# Patient Record
Sex: Female | Born: 1965 | Race: White | Hispanic: No | Marital: Married | Smoking: Current every day smoker
Health system: Southern US, Community
[De-identification: ages and names within clinical notes are randomized; demographics above are authoritative.]

## PROBLEM LIST (undated history)

## (undated) ENCOUNTER — Inpatient Hospital Stay (HOSPITAL_COMMUNITY): Payer: No Typology Code available for payment source

## (undated) ENCOUNTER — Emergency Department (HOSPITAL_COMMUNITY): Admission: EM | Discharge status: Against Medical Advice | Payer: No Typology Code available for payment source

## (undated) HISTORY — PX: OTHER SURGICAL HISTORY: SHX169

---

## 1997-12-03 ENCOUNTER — Other Ambulatory Visit: Admission: RE | Admit: 1997-12-03 | Discharge: 1997-12-03 | Payer: Self-pay | Admitting: Obstetrics & Gynecology

## 1997-12-10 ENCOUNTER — Inpatient Hospital Stay (HOSPITAL_COMMUNITY): Admission: AD | Admit: 1997-12-10 | Discharge: 1997-12-16 | Payer: Self-pay | Admitting: Obstetrics and Gynecology

## 2012-10-09 ENCOUNTER — Encounter (HOSPITAL_COMMUNITY): Payer: Self-pay | Admitting: *Deleted

## 2012-10-09 ENCOUNTER — Emergency Department (HOSPITAL_COMMUNITY): Payer: Medicaid Other

## 2012-10-09 ENCOUNTER — Observation Stay (HOSPITAL_COMMUNITY)
Admission: EM | Admit: 2012-10-09 | Discharge: 2012-10-10 | Disposition: A | Discharge status: Home / Self Care | Payer: Medicaid Other | Attending: Cardiology | Admitting: Cardiology

## 2012-10-09 DIAGNOSIS — Z8249 Family history of ischemic heart disease and other diseases of the circulatory system: Secondary | ICD-10-CM | POA: Insufficient documentation

## 2012-10-09 DIAGNOSIS — R079 Chest pain, unspecified: Principal | ICD-10-CM | POA: Insufficient documentation

## 2012-10-09 DIAGNOSIS — R0602 Shortness of breath: Secondary | ICD-10-CM | POA: Insufficient documentation

## 2012-10-09 DIAGNOSIS — Z87891 Personal history of nicotine dependence: Secondary | ICD-10-CM | POA: Insufficient documentation

## 2012-10-09 DIAGNOSIS — K219 Gastro-esophageal reflux disease without esophagitis: Secondary | ICD-10-CM | POA: Insufficient documentation

## 2012-10-09 LAB — CBC
Hemoglobin: 14.6 g/dL (ref 12.0–15.0)
MCV: 92.8 fL (ref 78.0–100.0)
Platelets: 259 10*3/uL (ref 150–400)
RBC: 4.46 MIL/uL (ref 3.87–5.11)
WBC: 7.3 10*3/uL (ref 4.0–10.5)

## 2012-10-09 LAB — CBC WITH DIFFERENTIAL/PLATELET
Basophils Absolute: 0 10*3/uL (ref 0.0–0.1)
Basophils Relative: 0 % (ref 0–1)
Eosinophils Absolute: 0.1 10*3/uL (ref 0.0–0.7)
MCH: 32.4 pg (ref 26.0–34.0)
MCHC: 35.2 g/dL (ref 30.0–36.0)
Monocytes Relative: 5 % (ref 3–12)
Neutro Abs: 6.1 10*3/uL (ref 1.7–7.7)
Neutrophils Relative %: 71 % (ref 43–77)
Platelets: 257 10*3/uL (ref 150–400)
RDW: 12.8 % (ref 11.5–15.5)

## 2012-10-09 LAB — COMPREHENSIVE METABOLIC PANEL
ALT: 12 U/L (ref 0–35)
ALT: 11 U/L (ref 0–35)
AST: 16 U/L (ref 0–37)
Albumin: 3.6 g/dL (ref 3.5–5.2)
CO2: 26 mEq/L (ref 19–32)
Calcium: 9.3 mg/dL (ref 8.4–10.5)
Chloride: 104 mEq/L (ref 96–112)
Creatinine, Ser: 0.79 mg/dL (ref 0.50–1.10)
GFR calc Af Amer: >90 mL/min (ref >90)
GFR calc non Af Amer: >90 mL/min (ref >90)
Glucose, Bld: 117 mg/dL — ABNORMAL HIGH (ref 70–99)
Potassium: 3.6 mEq/L (ref 3.5–5.1)
Sodium: 138 mEq/L (ref 135–145)
Total Bilirubin: 0.2 mg/dL — ABNORMAL LOW (ref 0.3–1.2)
Total Protein: 6.6 g/dL (ref 6.0–8.3)

## 2012-10-09 LAB — PROTIME-INR
INR: 0.99 (ref 0.00–1.49)
Prothrombin Time: 13 seconds (ref 11.6–15.2)

## 2012-10-09 LAB — TROPONIN I: Troponin I: <0.3 ng/mL (ref <0.30)

## 2012-10-09 LAB — POCT I-STAT TROPONIN I: Troponin i, poc: 0 ng/mL (ref 0.00–0.08)

## 2012-10-09 LAB — APTT: aPTT: 36 seconds (ref 24–37)

## 2012-10-09 MED ORDER — ASPIRIN 300 MG RE SUPP
300.0000 mg | RECTAL | Status: DC
  Filled 2012-10-09: qty 1

## 2012-10-09 MED ORDER — METOPROLOL TARTRATE 12.5 MG HALF TABLET
12.5000 mg | ORAL_TABLET | Freq: Two times a day (BID) | ORAL | Status: DC
  Administered 2012-10-09: 12.5 mg via ORAL
  Filled 2012-10-09 (×3): qty 1

## 2012-10-09 MED ORDER — HEPARIN SODIUM (PORCINE) 5000 UNIT/ML IJ SOLN
5000.0000 [IU] | Freq: Three times a day (TID) | INTRAMUSCULAR | Status: DC
  Filled 2012-10-09 (×5): qty 1

## 2012-10-09 MED ORDER — ASPIRIN 81 MG PO CHEW: 324.0000 mg | CHEWABLE_TABLET | ORAL | Status: DC

## 2012-10-09 MED ORDER — IOHEXOL 350 MG/ML SOLN
100.0000 mL | Freq: Once | INTRAVENOUS | Status: AC | PRN
  Administered 2012-10-09: 100 mL via INTRAVENOUS

## 2012-10-09 MED ORDER — ONDANSETRON HCL 4 MG/2ML IJ SOLN: 4.0000 mg | Freq: Four times a day (QID) | INTRAMUSCULAR | Status: DC | PRN

## 2012-10-09 MED ORDER — ATORVASTATIN CALCIUM 40 MG PO TABS
40.0000 mg | ORAL_TABLET | Freq: Every day | ORAL | Status: DC
  Filled 2012-10-09: qty 1

## 2012-10-09 MED ORDER — NITROGLYCERIN 0.4 MG SL SUBL: 0.4000 mg | SUBLINGUAL_TABLET | SUBLINGUAL | Status: DC | PRN

## 2012-10-09 MED ORDER — ACETAMINOPHEN 325 MG PO TABS: 650.0000 mg | ORAL_TABLET | ORAL | Status: DC | PRN

## 2012-10-09 MED ORDER — ASPIRIN 81 MG PO CHEW
324.0000 mg | CHEWABLE_TABLET | Freq: Once | ORAL | Status: AC
  Administered 2012-10-09: 324 mg via ORAL
  Filled 2012-10-09 (×2): qty 4

## 2012-10-09 MED ORDER — ASPIRIN EC 81 MG PO TBEC
81.0000 mg | DELAYED_RELEASE_TABLET | Freq: Every day | ORAL | Status: DC
  Administered 2012-10-10: 81 mg via ORAL
  Filled 2012-10-09: qty 1

## 2012-10-09 MED ORDER — PANTOPRAZOLE SODIUM 40 MG PO TBEC
40.0000 mg | DELAYED_RELEASE_TABLET | Freq: Every day | ORAL | Status: DC
  Filled 2012-10-09: qty 1

## 2012-10-09 NOTE — ED Notes (Signed)
Pt currently denying chest pain at the moment.

## 2012-10-09 NOTE — ED Provider Notes (Signed)
History     CSN: [id number]  Arrival date & time 10/09/12  1319   First MD Initiated Contact with Patient 10/09/12 1655      Chief Complaint  Patient presents with  . Chest Pain    (Consider location/radiation/quality/duration/timing/severity/associated sxs/prior treatment) Patient is a 47 y.o. female presenting with chest pain. The history is provided by the patient.  Chest Pain Pain location:  L chest Pain quality: pressure   Pain radiates to:  Does not radiate Pain radiates to the back: no   Pain severity:  Moderate Onset quality:  Unable to specify Timing:  Constant Progression:  Waxing and waning Chronicity:  New Context: breathing   Context: no drug use, not eating, no intercourse, not lifting, no movement, not raising an arm, not at rest, no stress and no trauma   Worsened by:  Nothing tried Ineffective treatments:  None tried Associated symptoms: shortness of breath   Associated symptoms: no abdominal pain   Risk factors: smoking   Risk factors comment:  Family history- dad died 70 mi    History reviewed. No pertinent past medical history.  Past Surgical History  Procedure Laterality Date  . Cesarian    . Cesarean section      No family history on file.  History  Substance Use Topics  . Smoking status: Current Every Day Smoker -- 1.00 packs/day    Types: Cigarettes  . Smokeless tobacco: Not on file  . Alcohol Use: Yes     Comment: occasional    OB History   Grav Para Term Preterm Abortions TAB SAB Ect Mult Living                  Review of Systems  Constitutional: Positive for unexpected weight change.  HENT: Positive for neck pain.   Eyes: Negative.   Respiratory: Positive for shortness of breath.   Cardiovascular: Positive for chest pain.  Gastrointestinal: Negative.  Negative for abdominal pain.  Endocrine: Negative.   Genitourinary: Negative.   Skin: Negative.   Allergic/Immunologic: Negative.   Neurological: Negative.    Hematological: Negative.   Psychiatric/Behavioral: Negative.     Allergies  Review of patient's allergies indicates no known allergies.  Home Medications  No current outpatient prescriptions on file.  BP 151/82  Pulse 94  Temp(Src) 98 F (36.7 C) (Oral)  Resp 20  Ht 5\' 7"  (1.702 m)  Wt 131 lb (59.421 kg)  BMI 20.51 kg/m2  SpO2 94%  LMP 10/01/2012  Physical Exam  ED Course  Procedures (including critical care time)  Labs Reviewed  COMPREHENSIVE METABOLIC PANEL - Abnormal; Notable for the following:    Total Bilirubin 0.2 (*)    All other components within normal limits  CBC  LIPASE, BLOOD  TROPONIN I  POCT I-STAT TROPONIN I   Dg Chest 2 View  10/09/2012  *RADIOLOGY REPORT*  Clinical Data: Chest pain and shortness of breath.  CHEST - 2 VIEW  Comparison: No priors.  Findings: Lung volumes are normal.  No consolidative airspace disease.  No pleural effusions.  No pneumothorax.  No pulmonary nodule or mass noted.  Pulmonary vasculature and the cardiomediastinal silhouette are within normal limits.  IMPRESSION: 1. No radiographic evidence of acute cardiopulmonary disease.   Original Report Authenticated By: Trudie Reed, M.D.      No diagnosis found.   Dg Chest 2 View  10/09/2012  *RADIOLOGY REPORT*  Clinical Data: Chest pain and shortness of breath.  CHEST - 2 VIEW  Comparison: No  priors.  Findings: Lung volumes are normal.  No consolidative airspace disease.  No pleural effusions.  No pneumothorax.  No pulmonary nodule or mass noted.  Pulmonary vasculature and the cardiomediastinal silhouette are within normal limits.  IMPRESSION: 1. No radiographic evidence of acute cardiopulmonary disease.   Original Report Authenticated By: Trudie Reed, M.D.    Ct Angio Chest Pe W/cm &/or Wo Cm  10/09/2012  *RADIOLOGY REPORT*  Clinical Data: Left-sided chest pain  CT ANGIOGRAPHY CHEST  Technique:  Multidetector CT imaging of the chest using the standard protocol during bolus  administration of intravenous contrast. Multiplanar reconstructed images including MIPs were obtained and reviewed to evaluate the vascular anatomy.  Contrast: OMNIPAQUE IOHEXOL 350 MG/ML SOLN  Comparison: Chest radiograph same date  Findings: Diffuse emphysematous changes are noted.  Central airways are patent.  Minimal dependent bibasilar, right greater than left, presumed atelectasis.  No nodule, mass, or consolidation.  Thyroid is grossly normal.  Great vessels are normal in caliber. Heart size is normal.  No lymphadenopathy.  No pericardial or pleural effusion.  Incomplete imaging of the upper abdomen demonstrates normal-appearing adrenal glands.  The study is of adequate technical quality for evaluation for pulmonary embolism up to and including the 3rd order pulmonary arteries. No focal filling defect is seen to suggest acute pulmonary embolism.  No acute osseous finding.  IMPRESSION: No acute cardiopulmonary process or evidence for acute pulmonary embolism.  Emphysematous changes.   Original Report Authenticated By: Christiana Pellant, M.D.    MDM   this is a 47 year old female with a history of smoking since age 34 and a father who died at age 57 of massive myocardial infarction. She has been having left-sided chest pain intermittently for 3 weeks. EKG initial evaluation revealed no evidence of cardiac ischemia. However, I am concerned due to her strong family history, history of smoking that this is angina. I have spoken with Dr. Sharyn Lull and he will be in to see and evaluate the patient.       Hilario Quarry, MD 10/09/12 2032

## 2012-10-09 NOTE — ED Notes (Signed)
Pt c/o L sided chest pain that radiates to her central back and up her L neck x 3 weeks.  C/o sob with increased activity, but denies edema.  C/o nausea and vomiting yesterday am.

## 2012-10-09 NOTE — H&P (Signed)
[name] is an 47 y.o. female.   Chief Complaint: Recurrent chest pain  HPI: Patient is 47 year old female with no significant past medical history except for tobacco abuse 30 pack years, strong family history of coronary artery disease father died at age of 32 due to massive MI came to the ER complaining of recurrent retrosternal and left-sided chest pain off and on for last few weeks today pain got worse radiated to left shoulder and left side of neck sore decided to come to the ER. Denies nausea vomiting diaphoresis. Denies palpitation lightheadedness or syncope. Patient denies any history of PND orthopnea leg swelling Complains of mild shortness of breath. Denies cough fever chills. Denies any history of exertional chest pain. Denies any cardiac workup in the past. Denies any history of exertional chest pain although activity limited. Denies any relation of chest pain to food breathing or movement.  History reviewed. No pertinent past medical history.  Past Surgical History  Procedure Laterality Date  . Cesarian    . Cesarean section      No family history on file. Social History:  reports that she has been smoking Cigarettes.  She has been smoking about 1.00 pack per day. She does not have any smokeless tobacco history on file. She reports that  drinks alcohol. She reports that she uses illicit drugs (Marijuana).  Allergies: No Known Allergies   (Not in a hospital admission)  Results for orders placed during the hospital encounter of 10/09/12 (from the past 48 hour(s))  CBC     Status: None   Collection Time    10/09/12  1:40 PM      Result Value Range   WBC 7.3  4.0 - 10.5 K/uL   RBC 4.46  3.87 - 5.11 MIL/uL   Hemoglobin 14.6  12.0 - 15.0 g/dL   HCT 16.1  09.6 - 04.5 %   MCV 92.8  78.0 - 100.0 fL   MCH 32.7  26.0 - 34.0 pg   MCHC 35.3  30.0 - 36.0 g/dL   RDW 40.9  81.1 - 91.4 %   Platelets 259  150 - 400 K/uL  COMPREHENSIVE METABOLIC PANEL     Status: Abnormal   Collection Time    10/09/12  1:40 PM      Result Value Range   Sodium 139  135 - 145 mEq/L   Potassium 3.6  3.5 - 5.1 mEq/L   Chloride 104  96 - 112 mEq/L   CO2 26  19 - 32 mEq/L   Glucose, Bld 92  70 - 99 mg/dL   BUN 6  6 - 23 mg/dL   Creatinine, Ser 7.82  0.50 - 1.10 mg/dL   Calcium 9.4  8.4 - 95.6 mg/dL   Total Protein 6.7  6.0 - 8.3 g/dL   Albumin 3.7  3.5 - 5.2 g/dL   AST 16  0 - 37 U/L   ALT 12  0 - 35 U/L   Alkaline Phosphatase 62  39 - 117 U/L   Total Bilirubin 0.2 (*) 0.3 - 1.2 mg/dL   GFR calc non Af Amer >90  >90 mL/min   GFR calc Af Amer >90  >90 mL/min   Comment:            The eGFR has been calculated     using the CKD EPI equation.     This calculation has not been     validated in all clinical     situations.  eGFR's persistently     <90 mL/min signify     possible Chronic Kidney Disease.  LIPASE, BLOOD     Status: None   Collection Time    10/09/12  1:40 PM      Result Value Range   Lipase 33  11 - 59 U/L  POCT I-STAT TROPONIN I     Status: None   Collection Time    10/09/12  1:56 PM      Result Value Range   Troponin i, poc 0.00  0.00 - 0.08 ng/mL   Comment 3            Comment: Due to the release kinetics of cTnI,     a negative result within the first hours     of the onset of symptoms does not rule out     myocardial infarction with certainty.     If myocardial infarction is still suspected,     repeat the test at appropriate intervals.  TROPONIN I     Status: None   Collection Time    10/09/12  5:21 PM      Result Value Range   Troponin I <0.30  <0.30 ng/mL   Comment:            Due to the release kinetics of cTnI,     a negative result within the first hours     of the onset of symptoms does not rule out     myocardial infarction with certainty.     If myocardial infarction is still suspected,     repeat the test at appropriate intervals.   Dg Chest 2 View  10/09/2012  *RADIOLOGY REPORT*  Clinical Data: Chest pain and shortness of  breath.  CHEST - 2 VIEW  Comparison: No priors.  Findings: Lung volumes are normal.  No consolidative airspace disease.  No pleural effusions.  No pneumothorax.  No pulmonary nodule or mass noted.  Pulmonary vasculature and the cardiomediastinal silhouette are within normal limits.  IMPRESSION: 1. No radiographic evidence of acute cardiopulmonary disease.   Original Report Authenticated By: Trudie Reed, M.D.    Ct Angio Chest Pe W/cm &/or Wo Cm  10/09/2012  *RADIOLOGY REPORT*  Clinical Data: Left-sided chest pain  CT ANGIOGRAPHY CHEST  Technique:  Multidetector CT imaging of the chest using the standard protocol during bolus administration of intravenous contrast. Multiplanar reconstructed images including MIPs were obtained and reviewed to evaluate the vascular anatomy.  Contrast: OMNIPAQUE IOHEXOL 350 MG/ML SOLN  Comparison: Chest radiograph same date  Findings: Diffuse emphysematous changes are noted.  Central airways are patent.  Minimal dependent bibasilar, right greater than left, presumed atelectasis.  No nodule, mass, or consolidation.  Thyroid is grossly normal.  Great vessels are normal in caliber. Heart size is normal.  No lymphadenopathy.  No pericardial or pleural effusion.  Incomplete imaging of the upper abdomen demonstrates normal-appearing adrenal glands.  The study is of adequate technical quality for evaluation for pulmonary embolism up to and including the 3rd order pulmonary arteries. No focal filling defect is seen to suggest acute pulmonary embolism.  No acute osseous finding.  IMPRESSION: No acute cardiopulmonary process or evidence for acute pulmonary embolism.  Emphysematous changes.   Original Report Authenticated By: Christiana Pellant, M.D.     Review of Systems  Constitutional: Negative for fever and chills.  Eyes: Negative for blurred vision and double vision.  Respiratory: Negative for cough, hemoptysis, sputum production and shortness of breath.  Cardiovascular:  Positive for chest pain. Negative for palpitations, orthopnea, claudication, leg swelling and PND.  Gastrointestinal: Negative for nausea, vomiting and abdominal pain.  Neurological: Negative for dizziness and headaches.    Blood pressure 127/70, pulse 66, temperature 98 F (36.7 C), temperature source Oral, resp. rate 13, height 5\' 7"  (1.702 m), weight 59.421 kg (131 lb), last menstrual period 10/01/2012, SpO2 99.00%. Physical Exam  Constitutional: She is oriented to person, place, and time.  HENT:  Head: Normocephalic and atraumatic.  Eyes: Conjunctivae and EOM are normal. Left eye exhibits no discharge. No scleral icterus.  Neck: Neck supple. No JVD present. No tracheal deviation present. No thyromegaly present.  Cardiovascular: Normal rate, regular rhythm and normal heart sounds.  Exam reveals no friction rub.   No murmur heard. Respiratory: Effort normal and breath sounds normal. No respiratory distress. She has no wheezes. She has no rales.  GI: Soft. Bowel sounds are normal. She exhibits no distension. There is no tenderness. There is no rebound and no guarding.  Musculoskeletal: She exhibits no edema and no tenderness.  Neurological: She is alert and oriented to person, place, and time.     Assessment/Plan Recurrent chest pain rule out coronary insufficiency Tobacco abuse Strong family history of coronary artery disease Plan And as per orders Schedule for nuclear stress test tomorrow  discussed with patient regarding smoking cessation and lifestyle modification states she will try to quit smoking Kadin Canipe N 10/09/2012, 9:08 PM

## 2012-10-10 ENCOUNTER — Observation Stay (HOSPITAL_COMMUNITY): Payer: Medicaid Other

## 2012-10-10 ENCOUNTER — Encounter (HOSPITAL_COMMUNITY): Payer: Medicaid Other | Attending: Cardiology

## 2012-10-10 ENCOUNTER — Encounter (HOSPITAL_COMMUNITY): Payer: Medicaid Other

## 2012-10-10 DIAGNOSIS — F17200 Nicotine dependence, unspecified, uncomplicated: Secondary | ICD-10-CM | POA: Insufficient documentation

## 2012-10-10 DIAGNOSIS — Z8249 Family history of ischemic heart disease and other diseases of the circulatory system: Secondary | ICD-10-CM | POA: Insufficient documentation

## 2012-10-10 DIAGNOSIS — R079 Chest pain, unspecified: Secondary | ICD-10-CM | POA: Insufficient documentation

## 2012-10-10 LAB — CBC
HCT: 40.3 % (ref 36.0–46.0)
Hemoglobin: 14 g/dL (ref 12.0–15.0)
MCH: 32.1 pg (ref 26.0–34.0)
RBC: 4.36 MIL/uL (ref 3.87–5.11)

## 2012-10-10 LAB — BASIC METABOLIC PANEL
BUN: 8 mg/dL (ref 6–23)
CO2: 30 mEq/L (ref 19–32)
Calcium: 9.6 mg/dL (ref 8.4–10.5)
Glucose, Bld: 102 mg/dL — ABNORMAL HIGH (ref 70–99)
Potassium: 4.7 mEq/L (ref 3.5–5.1)
Sodium: 139 mEq/L (ref 135–145)

## 2012-10-10 LAB — TSH: TSH: 4.498 u[IU]/mL (ref 0.350–4.500)

## 2012-10-10 LAB — LIPID PANEL
HDL: 57 mg/dL (ref >39)
LDL Cholesterol: 69 mg/dL (ref 0–99)
Triglycerides: 56 mg/dL (ref <150)
VLDL: 11 mg/dL (ref 0–40)

## 2012-10-10 LAB — TROPONIN I: Troponin I: <0.3 ng/mL (ref <0.30)

## 2012-10-10 MED ORDER — TECHNETIUM TC 99M SESTAMIBI GENERIC - CARDIOLITE
10.0000 | Freq: Once | INTRAVENOUS | Status: AC | PRN
  Administered 2012-10-10: 10 via INTRAVENOUS

## 2012-10-10 MED ORDER — ASPIRIN 81 MG PO TBEC: 81.0000 mg | DELAYED_RELEASE_TABLET | Freq: Every day | ORAL | Status: DC

## 2012-10-10 MED ORDER — PANTOPRAZOLE SODIUM 40 MG PO TBEC: 40.0000 mg | DELAYED_RELEASE_TABLET | Freq: Every day | ORAL | Status: DC

## 2012-10-10 MED ORDER — NITROGLYCERIN 0.4 MG SL SUBL: 0.4000 mg | SUBLINGUAL_TABLET | SUBLINGUAL | Status: DC | PRN

## 2012-10-10 MED ORDER — TECHNETIUM TC 99M SESTAMIBI GENERIC - CARDIOLITE
30.0000 | Freq: Once | INTRAVENOUS | Status: AC | PRN
  Administered 2012-10-10: 30 via INTRAVENOUS

## 2012-10-10 NOTE — Discharge Instructions (Signed)
Chest Pain (Nonspecific) It is often hard to give a specific diagnosis for the cause of chest pain. There is always a chance that your pain could be related to something serious, such as a heart attack or a blood clot in the lungs. You need to follow up with your caregiver for further evaluation. CAUSES   Heartburn.  Pneumonia or bronchitis.  Anxiety or stress.  Inflammation around your heart (pericarditis) or lung (pleuritis or pleurisy).  A blood clot in the lung.  A collapsed lung (pneumothorax). It can develop suddenly on its own (spontaneous pneumothorax) or from injury (trauma) to the chest.  Shingles infection (herpes zoster virus). The chest wall is composed of bones, muscles, and cartilage. Any of these can be the source of the pain.  The bones can be bruised by injury.  The muscles or cartilage can be strained by coughing or overwork.  The cartilage can be affected by inflammation and become sore (costochondritis). DIAGNOSIS  Lab tests or other studies, such as X-rays, electrocardiography, stress testing, or cardiac imaging, may be needed to find the cause of your pain.  TREATMENT   Treatment depends on what may be causing your chest pain. Treatment may include:  Acid blockers for heartburn.  Anti-inflammatory medicine.  Pain medicine for inflammatory conditions.  Antibiotics if an infection is present.  You may be advised to change lifestyle habits. This includes stopping smoking and avoiding alcohol, caffeine, and chocolate.  You may be advised to keep your head raised (elevated) when sleeping. This reduces the chance of acid going backward from your stomach into your esophagus.  Most of the time, nonspecific chest pain will improve within 2 to 3 days with rest and mild pain medicine. HOME CARE INSTRUCTIONS   If antibiotics were prescribed, take your antibiotics as directed. Finish them even if you start to feel better.  For the next few days, avoid physical  activities that bring on chest pain. Continue physical activities as directed.  Do not smoke.  Avoid drinking alcohol.  Only take over-the-counter or prescription medicine for pain, discomfort, or fever as directed by your caregiver.  Follow your caregiver's suggestions for further testing if your chest pain does not go away.  Keep any follow-up appointments you made. If you do not go to an appointment, you could develop lasting (chronic) problems with pain. If there is any problem keeping an appointment, you must call to reschedule. SEEK MEDICAL CARE IF:   You think you are having problems from the medicine you are taking. Read your medicine instructions carefully.  Your chest pain does not go away, even after treatment.  You develop a rash with blisters on your chest. SEEK IMMEDIATE MEDICAL CARE IF:   You have increased chest pain or pain that spreads to your arm, neck, jaw, back, or abdomen.  You develop shortness of breath, an increasing cough, or you are coughing up blood.  You have severe back or abdominal pain, feel nauseous, or vomit.  You develop severe weakness, fainting, or chills.  You have a fever. THIS IS AN EMERGENCY. Do not wait to see if the pain will go away. Get medical help at once. Call your local emergency services (911 in [place] Do not drive yourself to the hospital. MAKE SURE YOU:   Understand these instructions.  Will watch your condition.  Will get help right away if you are not doing well or get worse. Document Released: 03/28/2005 Document Revised: 09/10/2011 Document Reviewed: 01/22/2008 [facility] Patient Information 2013 [employer].  Aspirin and Your Heart Aspirin affects the way your blood clots and helps "thin" the blood. Aspirin has many uses in heart disease. It may be used as a primary prevention to help reduce the risk of heart related events. It also can be used as a secondary measure to prevent more heart attacks or to prevent  additional damage from blood clots.  ASPIRIN MAY HELP IF YOU:  Have had a heart attack or chest pain.  Have undergone open heart surgery such as CABG (Coronary Artery Bypass Surgery).  Have had coronary angioplasty with or without stents.  Have experienced a stroke or TIA (transient ischemic attack).  Have peripheral vascular disease (PAD).  Have chronic heart rhythm problems such as atrial fibrillation.  Are at risk for heart disease. BEFORE STARTING ASPIRIN Before you start taking aspirin, your caregiver will need to review your medical history. Many things will need to be taken into consideration, such as:  Smoking status.  Blood pressure.  Diabetes.  Gender.  Weight.  Cholesterol level. ASPIRIN DOSES  Aspirin should only be taken on the advice of your caregiver. Talk to your caregiver about how much aspirin you should take. Aspirin comes in different doses such as:  81 mg.  162 mg.  325 mg.  The aspirin dose you take may be affected by many factors, some of which include:  Your current medications, especially if your are taking blood-thinners or anti-platelet medicine.  Liver function.  Heart disease risk.  Age.  Aspirin comes in two forms:  Non-enteric-coated. This type of aspirin does not have a coating and is absorbed faster. Non-enteric coated aspirin is recommended for patients experiencing chest pain symptoms. This type of aspirin also comes in a chewable form.  Enteric-coated. This means the aspirin has a special coating that releases the medicine very slowly. Enteric-coated aspirin causes less stomach upset. This type of aspirin should not be chewed or crushed. ASPIRIN SIDE EFFECTS Daily use of aspirin can increase your risk of serious side effects, some of these include:  Increased bleeding. This can range from a cut that does not stop bleeding to more serious problems such as stomach bleeding or bleeding into the brain (Intracerebral  bleeding).  Increased bruising.  Stomach upset.  An allergic reaction such as red, itchy skin.  Increased risk of bleeding when combined with non-steroidal anti-inflammatory medicine (NSAIDS).  Alcohol should be drank in moderation when taking aspirin. Alcohol can increase the risk of stomach bleeding when taken with aspirin.  Aspirin should not be given to children less than 39 years of age due to the association of Reye syndrome. Reye syndrome is a serious illness that can affect the brain and liver. Studies have linked Reye syndrome with aspirin use in children.  People that have nasal polyps have an increased risk of developing an aspirin allergy. SEEK MEDICAL CARE IF:   You develop an allergic reaction such as:  Hives.  Itchy skin.  Swelling of the lips, tongue or face.  You develop stomach pain.  You have unusual bleeding or bruising.  You have ringing in your ears. SEEK IMMEDIATE MEDICAL CARE IF:   You have severe chest pain, especially if the pain is crushing or pressure-like and spreads to the arms, back, neck, or jaw. THIS IS AN EMERGENCY. Do not wait to see if the pain will go away. Get medical help at once. Call your local emergency services (911 in the [place] DO NOT drive yourself to the hospital.  You  have stroke-like symptoms such as:  Loss of vision.  Difficulty talking.  Numbness or weakness on one side of your body.  Numbness or weakness in your arm or leg.  Not thinking clearly or feeling confused.  Your bowel movements are bloody, dark red or black in color.  You vomit or cough up blood.  You have blood in your urine.  You have shortness of breath, coughing or wheezing. MAKE SURE YOU:   Understand these instructions.  Will monitor your condition.  Seek immediate medical care if necessary. Document Released: 05/31/2008 Document Revised: 09/10/2011 Document Reviewed: 05/31/2008 [facility] Patient Information 2013 [place], [place].

## 2012-10-10 NOTE — Discharge Summary (Signed)
  Discharge summary dictated on 10/10/2012 dictation number is [id number]

## 2012-10-10 NOTE — Plan of Care (Signed)
Problem: Consults Goal: Diabetes Guidelines if Diabetic/Glucose > 140 If diabetic or lab glucose is > 140 mg/dl - Initiate Diabetes/Hyperglycemia Guidelines & Document Interventions  Outcome: Not Applicable Date Met:  10/10/12 Pt is not diabetic  Problem: Phase I Progression Outcomes Goal: Anginal pain relieved Outcome: Completed/Met Date Met:  10/10/12 Pt has been pain free since arrival to floor. Goal: Aspirin unless contraindicated Outcome: Completed/Met Date Met:  10/10/12 324 mg of ASA given in Ed. Goal: MD aware of Cardiac Marker results Outcome: Completed/Met Date Met:  10/10/12 Enzymes have been negative times two. Goal: Voiding-avoid urinary catheter unless indicated Outcome: Not Applicable Date Met:  10/10/12 Pt voids independently  Problem: Phase II Progression Outcomes Goal: Stress Test if indicated Outcome: Progressing Plan for stress test this morning

## 2012-10-10 NOTE — Progress Notes (Signed)
Utilization review completed.  

## 2012-10-11 NOTE — Discharge Summary (Signed)
[name], [name] NO.:  [id number]  MEDICAL RECORD NO.:  [id number]  LOCATION:  3W02C                        FACILITY:  [facility]  PHYSICIAN:  Eduardo Osier. Sharyn Lull, M.D. DATE OF BIRTH:  January 20, 1966  DATE OF ADMISSION:  10/09/2012 DATE OF DISCHARGE:  10/10/2012                              DISCHARGE SUMMARY   ADMITTING DIAGNOSES: 1. Recurrent chest pain, rule out coronary insufficiency. 2. Tobacco abuse. 3. Strong family history of coronary artery disease.  DISCHARGE DIAGNOSES: 1. Status post chest pain, myocardial infarction ruled out negative     stress Myoview. 2. Tobacco abuse. 3. Strong family history of coronary artery disease. 4. Gastroesophageal reflux disease.  DISCHARGE HOME MEDICATIONS: 1. Enteric-coated aspirin 81 mg 1 tablet daily. 2. Nitrostat 0.4 mg sublingual use as directed. 3. Protonix 40 mg 1 tablet daily.  DIET:  Low-salt, low-cholesterol.  The patient has been counseled regarding smoking cessation which she agrees to stop.  CONDITION AT DISCHARGE:  Stable.  FOLLOWUP:  With me in 1 week.  BRIEF HISTORY AND HOSPITAL COURSE:  Mr. [name] is a 47 year old female with no significant past medical history except for tobacco abuse, 30 pack years, strong family history of coronary artery disease.  Father died at the age of 86 due to massive MI.  She came to the ER complaining of recurrent retrosternal and left-sided chest pain off and on for last few weeks, chest pain got worse, radiating to the left shoulder and left side of the neck, so decided to come to the ER.  The patient denies any nausea, vomiting, diaphoresis.  Denies palpitation, lightheadedness, or syncope.  The patient denies any history of PND, orthopnea, or leg swelling, complains of mild shortness of breath.  Denies any cough, fever, chills.  Denies any history of exertional chest pain.  Denies any cardiac workup in the past.  Denies any history of exertional dyspnea. The patient  denies any relation of chest pain to food, breathing, or movement.  PAST MEDICAL HISTORY:  As above.  PAST SURGICAL HISTORY:  She had C-section in the past.  SOCIAL HISTORY:  She smokes 1 pack per day for last 30 years.  No history of alcohol abuse.  PHYSICAL EXAMINATION:  GENERAL:  She was alert, awake, oriented x3. VITAL SIGNS:  Blood pressure was 127/70, pulse was 66.  She was afebrile.  Conjunctiva was pink. NECK:  Supple.  No JVD.  No bruit. LUNGS:  Clear to auscultation without rhonchi or rales. CARDIOVASCULAR:  S1, S2 was normal.  There was soft systolic murmur and there was no S3, gallop. ABDOMEN:  Soft.  Bowel sounds were present, nontender. EXTREMITIES:  There is no clubbing, cyanosis, or edema.  LABORATORY DATA:  Sodium was 139, potassium 3.6, BUN 6, creatinine 0.79, glucose was 92.  Cholesterol was 137, HDL 56, LDL 69, triglycerides were 56, hemoglobin was 14.6, hematocrit 41.4 white count of 7.3.  Three sets of troponin-I were normal.  Stress Myoview showed no perfusion defect, EF of 58%.  BRIEF HOSPITAL COURSE:  The patient was admitted to telemetry unit.  MI was ruled out by serial enzymes and EKG.  The patient subsequently underwent stress Myoview, which showed no evidence of reversible ischemia as  above.  The patient did not have any episodes of anginal chest pain during the hospital stay.  The patient is ambulating in hallway without any problems and will be discharged home on above medications and will be followed up in my office in 1 week.     Eduardo Osier. Sharyn Lull, M.D.     [facility]  D:  10/10/2012  T:  10/11/2012  Job:  [id number]

## 2012-10-25 NOTE — ED Provider Notes (Signed)
History     CSN: [id number]  Arrival date & time 10/09/12  1319   First MD Initiated Contact with Patient 10/09/12 1655      Chief Complaint  Patient presents with  . Chest Pain    (Consider location/radiation/quality/duration/timing/severity/associated sxs/prior treatment) HPI  History reviewed. No pertinent past medical history.  Past Surgical History  Procedure Laterality Date  . Cesarian    . Cesarean section      History reviewed. No pertinent family history.  History  Substance Use Topics  . Smoking status: Current Every Day Smoker -- 1.00 packs/day    Types: Cigarettes  . Smokeless tobacco: Not on file  . Alcohol Use: Yes     Comment: occasional    OB History   Grav Para Term Preterm Abortions TAB SAB Ect Mult Living                  Review of Systems  Allergies  Review of patient's allergies indicates no known allergies.  Home Medications   Current Outpatient Rx  Name  Route  Sig  Dispense  Refill  . aspirin EC 81 MG EC tablet   Oral   Take 1 tablet (81 mg total) by mouth daily.   30 tablet   3   . nitroGLYCERIN (NITROSTAT) 0.4 MG SL tablet   Sublingual   Place 1 tablet (0.4 mg total) under the tongue every 5 (five) minutes x 3 doses as needed for chest pain.   25 tablet   3   . pantoprazole (PROTONIX) 40 MG tablet   Oral   Take 1 tablet (40 mg total) by mouth daily at 6 (six) AM.   30 tablet   3     BP 100/66  Pulse 70  Temp(Src) 98.4 F (36.9 C) (Oral)  Resp 18  Ht 5\' 7"  (1.702 m)  Wt 127 lb 6.4 oz (57.788 kg)  BMI 19.95 kg/m2  SpO2 97%  LMP 10/01/2012  Physical Exam  Nursing note and vitals reviewed. Constitutional: She is oriented to person, place, and time. She appears well-developed and well-nourished.  HENT:  Head: Normocephalic and atraumatic.  Right Ear: External ear normal.  Left Ear: External ear normal.  Nose: Nose normal.  Mouth/Throat: Oropharynx is clear and moist.  Eyes: Conjunctivae and EOM are normal.  Pupils are equal, round, and reactive to light.  Neck: Normal range of motion. Neck supple.  Cardiovascular: Normal rate, regular rhythm, normal heart sounds and intact distal pulses.   Pulmonary/Chest: Effort normal and breath sounds normal.  Coarse bs  Abdominal: Soft. Bowel sounds are normal.  Musculoskeletal: Normal range of motion.  Neurological: She is alert and oriented to person, place, and time. She has normal reflexes.  Skin: Skin is warm and dry.  Psychiatric: She has a normal mood and affect. Her behavior is normal. Thought content normal.    ED Course  Procedures (including critical care time)  Labs Reviewed  COMPREHENSIVE METABOLIC PANEL - Abnormal; Notable for the following:    Total Bilirubin 0.2 (*)    All other components within normal limits  COMPREHENSIVE METABOLIC PANEL - Abnormal; Notable for the following:    Glucose, Bld 117 (*)    Total Bilirubin 0.2 (*)    All other components within normal limits  BASIC METABOLIC PANEL - Abnormal; Notable for the following:    Glucose, Bld 102 (*)    GFR calc non Af Amer 78 (*)    All other components within normal limits  CBC  LIPASE, BLOOD  TROPONIN I  TROPONIN I  TROPONIN I  TROPONIN I  PROTIME-INR  APTT  CBC WITH DIFFERENTIAL  TSH  MAGNESIUM  CBC  LIPID PANEL  POCT I-STAT TROPONIN I   No results found.   1. Chest pain      Date: 10/25/2012  Rate: 76  Rhythm: normal sinus rhythm  QRS Axis: normal  Intervals: normal  ST/T Wave abnormalities: normal  Conduction Disutrbances: none  Narrative Interpretation: unremarkable      MDM   47 y.o. Female with chest pain, family history of early mi, and smoker discussed with Dr. Sharyn Lull and he will see and evaluate.        Hilario Quarry, MD 10/25/12 1945

## 2012-11-23 ENCOUNTER — Emergency Department (HOSPITAL_COMMUNITY): Payer: Self-pay

## 2012-11-23 ENCOUNTER — Emergency Department (HOSPITAL_COMMUNITY)
Admission: EM | Admit: 2012-11-23 | Discharge: 2012-11-23 | Disposition: A | Discharge status: Home / Self Care | Payer: MEDICAID | Attending: Emergency Medicine | Admitting: Emergency Medicine

## 2012-11-23 DIAGNOSIS — S61411A Laceration without foreign body of right hand, initial encounter: Secondary | ICD-10-CM

## 2012-11-23 DIAGNOSIS — W540XXA Bitten by dog, initial encounter: Secondary | ICD-10-CM | POA: Insufficient documentation

## 2012-11-23 DIAGNOSIS — S61209A Unspecified open wound of unspecified finger without damage to nail, initial encounter: Secondary | ICD-10-CM | POA: Insufficient documentation

## 2012-11-23 DIAGNOSIS — S61409A Unspecified open wound of unspecified hand, initial encounter: Secondary | ICD-10-CM | POA: Insufficient documentation

## 2012-11-23 DIAGNOSIS — Z23 Encounter for immunization: Secondary | ICD-10-CM | POA: Insufficient documentation

## 2012-11-23 DIAGNOSIS — Y929 Unspecified place or not applicable: Secondary | ICD-10-CM | POA: Insufficient documentation

## 2012-11-23 DIAGNOSIS — S61219A Laceration without foreign body of unspecified finger without damage to nail, initial encounter: Secondary | ICD-10-CM

## 2012-11-23 DIAGNOSIS — Y9389 Activity, other specified: Secondary | ICD-10-CM | POA: Insufficient documentation

## 2012-11-23 DIAGNOSIS — F17200 Nicotine dependence, unspecified, uncomplicated: Secondary | ICD-10-CM | POA: Insufficient documentation

## 2012-11-23 MED ORDER — CLINDAMYCIN HCL 300 MG PO CAPS: 300.0000 mg | ORAL_CAPSULE | Freq: Four times a day (QID) | ORAL | Status: DC

## 2012-11-23 MED ORDER — TETANUS-DIPHTH-ACELL PERTUSSIS 5-2.5-18.5 LF-MCG/0.5 IM SUSP
0.5000 mL | Freq: Once | INTRAMUSCULAR | Status: AC
  Administered 2012-11-23: 0.5 mL via INTRAMUSCULAR
  Filled 2012-11-23: qty 0.5

## 2012-11-23 MED ORDER — OXYCODONE-ACETAMINOPHEN 5-325 MG PO TABS: 1.0000 | ORAL_TABLET | ORAL | Status: DC | PRN

## 2012-11-23 MED ORDER — IBUPROFEN 800 MG PO TABS: 800.0000 mg | ORAL_TABLET | Freq: Three times a day (TID) | ORAL | Status: DC

## 2012-11-23 MED ORDER — CLINDAMYCIN HCL 300 MG PO CAPS
300.0000 mg | ORAL_CAPSULE | Freq: Once | ORAL | Status: AC
  Administered 2012-11-23: 300 mg via ORAL
  Filled 2012-11-23: qty 1

## 2012-11-23 MED ORDER — IBUPROFEN 200 MG PO TABS
400.0000 mg | ORAL_TABLET | Freq: Once | ORAL | Status: AC
  Administered 2012-11-23: 400 mg via ORAL
  Filled 2012-11-23: qty 2

## 2012-11-23 MED ORDER — LIDOCAINE-EPINEPHRINE 2 %-1:100000 IJ SOLN: 20.0000 mL | Freq: Once | INTRAMUSCULAR | Status: DC

## 2012-11-23 NOTE — ED Provider Notes (Signed)
  Physical Exam  BP 123/74  Pulse 90  Temp(Src) 98.1 F (36.7 C) (Oral)  Resp 16  SpO2 98%  LMP 11/16/2012  Physical Exam  ED Course  LACERATION REPAIR Date/Time: 11/23/2012 6:12 AM Performed by: Arman Filter Authorized by: Arman Filter Consent: Verbal consent obtained. Risks and benefits: risks, benefits and alternatives were discussed Consent given by: patient Patient understanding: patient states understanding of the procedure being performed Patient identity confirmed: verbally with patient Body area: upper extremity Location details: right small finger Laceration length: 8 cm Foreign bodies: no foreign bodies Tendon involvement: superficial Nerve involvement: none Vascular damage: no Anesthesia: local infiltration Local anesthetic: lidocaine 1% without epinephrine Anesthetic total: 5 ml Patient sedated: no Preparation: Patient was prepped and draped in the usual sterile fashion. Irrigation solution: saline Irrigation method: syringe Amount of cleaning: extensive Debridement: none Degree of undermining: none Skin closure: 4-0 Prolene Subcutaneous closure: 4-0 Vicryl Number of sutures: 20 Technique: simple and horizontal mattress Approximation: loose Approximation difficulty: simple Dressing: antibiotic ointment and splint Patient tolerance: Patient tolerated the procedure well with no immediate complications. Comments: Wound irrigated extensively.  Patient has full range of motion    MDM Laceration repair      Arman Filter, NP 11/23/12 [phone]

## 2012-11-23 NOTE — Progress Notes (Signed)
Orthopedic Tech Progress Note Patient Details:  [name] January 04, 1966 [id number] Instructions from doctor to apply volar splint passed IP joint extending length of stitches on pinky. Patient's stiches treated and wrapped in gauze. Cotton roll applied to the length of splint. Volar splint applied as instructed being careful not to interfere with or intrap stitches. Application of splint tolerated well. Patient was asked if splint was too tight or uncomfortable and replied, "no."   Ortho Devices Type of Ortho Device: Volar splint Ortho Device/Splint Interventions: Application   Asia R Thompson 11/23/2012, 7:38 AM

## 2012-11-23 NOTE — ED Provider Notes (Signed)
History     CSN: [id number]  Arrival date & time 11/23/12  4098   First MD Initiated Contact with Patient 11/23/12 [phone]      Chief Complaint  Patient presents with  . Animal Bite    (Consider location/radiation/quality/duration/timing/severity/associated sxs/prior treatment) HPI  This is a late entry. This patient is a 47 year old right-hand-dominant woman who does not work outside the home.  The patient presents with dog bite to her right hand. The bite was sustained when she was trying to break up a fight between her dog and her son stopped. Both are pitbull's. Both have rabies vax up to date.   Pt has moderately severe pain, pain is nonradiating. She denies paresthesias and motor weakness. However, she is hesitant to move her small finger secondary to pain. Her last tetanus is unknown. She denies injury to any other region.  No past medical history on file.  Past Surgical History  Procedure Laterality Date  . Cesarian    . Cesarean section      No family history on file.  History  Substance Use Topics  . Smoking status: Current Every Day Smoker -- 1.00 packs/day    Types: Cigarettes  . Smokeless tobacco: Not on file  . Alcohol Use: Yes     Comment: occasional    OB History   Grav Para Term Preterm Abortions TAB SAB Ect Mult Living                  Review of Systems Gen: no weight loss, fevers, chills, night sweats Eyes: no discharge or drainage, no occular pain or visual changes Nose: no epistaxis or rhinorrhea Mouth: no dental pain, no sore throat Neck: no neck pain Lungs: no SOB, cough, wheezing CV: no chest pain, palpitations, dependent edema or orthopnea Abd: no abdominal pain, nausea, vomiting GU: no dysuria or gross hematuria MSK:as per history of present illness, otherwise negative Neuro: no headache, no focal neurologic deficits Skin: as per history of present illness, otherwise negative Psyche: negative.  Allergies  Review of patient's  allergies indicates no known allergies.  Home Medications   Current Outpatient Rx  Name  Route  Sig  Dispense  Refill  . clindamycin (CLEOCIN) 300 MG capsule   Oral   Take 1 capsule (300 mg total) by mouth 4 (four) times daily. X 7 days   28 capsule   0   . ibuprofen (ADVIL,MOTRIN) 800 MG tablet   Oral   Take 1 tablet (800 mg total) by mouth 3 (three) times daily.   21 tablet   0   . oxyCODONE-acetaminophen (PERCOCET) 5-325 MG per tablet   Oral   Take 1 tablet by mouth every 4 (four) hours as needed for pain.   18 tablet   0     BP 101/71  Pulse 80  Temp(Src) 97.6 F (36.4 C) (Oral)  Resp 16  SpO2 98%  LMP 11/16/2012  Physical Exam Gen: well developed and well nourished appearing Head: NCAT Eyes: PERL, EOMI Nose: no epistaixis or rhinorrhea Mouth/throat: mucosa is moist and pink Lungs: CTA B, no wheezing, rhonchi or rales Skin: no rashese, wnl Neuro: CN ii-xii grossly intact, no focal deficits Psyche; normal affect,  calm and cooperative.  EXT: 8 cm linear laceration over the bowl are aspect of the right hand and small finger. Laceration extends from the midpoint of the hyperthenar eminence distally to the region between the PIP and DIPs joints, the flexor tendon is exposed but appears intact.  Patient had full range of motion of all movements of all fingers, sensation is intact to light touch, cap refill is normal and symmetric.  ED Course  Procedures (including critical care time)  Labs Reviewed - No data to display Dg Hand Complete Right  11/23/2012   *RADIOLOGY REPORT*  Clinical Data: Animal bite to the right hand, with laceration at the fifth finger and metacarpal.  RIGHT HAND - COMPLETE 3+ VIEW  Comparison: None.  Findings: There is no definite evidence of osseous disruption. Significant soft tissue disruption is noted along the fifth digit, difficult to fully characterize due to the overlying bandage. Visualized joint spaces are grossly preserved.  No  radiopaque foreign bodies are seen.  The carpal rows appear grossly intact, and demonstrate normal alignment.  IMPRESSION: No definite evidence of osseous disruption; no radiopaque foreign bodies seen.   Original Report Authenticated By: Tonia Ghent, M.D.     1. Dog bite, initial encounter   2. Laceration of hand, right, initial encounter   3. Laceration of finger of right hand, initial encounter       MDM  Dog bite to dominant hand. Td updated. Lac repaired by MLP. First dose of prophylactic abx in ED. Splint for immobilization.  Case dsicussed with Dr. Izora Ribas who will see the patient for follow up on Tuesday. He agrees with ED management. Patient counseled to elevate hand and take abx exactly as prescribed. Counseled to return to th eED for signs or sx of infection and educated on these signs and sx.         Brandt Loosen, MD 11/23/12 [phone]

## 2012-11-23 NOTE — ED Notes (Signed)
Pt to xray

## 2012-11-23 NOTE — ED Provider Notes (Signed)
Medical screening examination/treatment/procedure(s) were performed by non-physician practitioner and as supervising physician I was immediately available for consultation/collaboration.   Brandt Loosen, MD 11/23/12 [phone]

## 2012-11-23 NOTE — ED Notes (Signed)
Pt c/o dog bite to right hand. 4 inch  LAC on right hand.

## 2012-12-15 ENCOUNTER — Encounter (HOSPITAL_COMMUNITY): Payer: Self-pay | Admitting: Emergency Medicine

## 2012-12-15 ENCOUNTER — Emergency Department (HOSPITAL_COMMUNITY): Payer: Medicaid Other

## 2012-12-15 ENCOUNTER — Observation Stay (HOSPITAL_COMMUNITY): Payer: Medicaid Other

## 2012-12-15 ENCOUNTER — Inpatient Hospital Stay (HOSPITAL_COMMUNITY)
Admission: EM | Admit: 2012-12-15 | Discharge: 2012-12-19 | DRG: 690 | Disposition: A | Discharge status: Home / Self Care | Payer: Medicaid Other | Attending: Internal Medicine | Admitting: Internal Medicine

## 2012-12-15 DIAGNOSIS — A498 Other bacterial infections of unspecified site: Secondary | ICD-10-CM | POA: Diagnosis present

## 2012-12-15 DIAGNOSIS — N390 Urinary tract infection, site not specified: Secondary | ICD-10-CM

## 2012-12-15 DIAGNOSIS — R509 Fever, unspecified: Secondary | ICD-10-CM | POA: Diagnosis present

## 2012-12-15 DIAGNOSIS — N179 Acute kidney failure, unspecified: Secondary | ICD-10-CM | POA: Diagnosis present

## 2012-12-15 DIAGNOSIS — F17200 Nicotine dependence, unspecified, uncomplicated: Secondary | ICD-10-CM | POA: Diagnosis present

## 2012-12-15 DIAGNOSIS — G4700 Insomnia, unspecified: Secondary | ICD-10-CM | POA: Diagnosis not present

## 2012-12-15 DIAGNOSIS — D696 Thrombocytopenia, unspecified: Secondary | ICD-10-CM | POA: Diagnosis present

## 2012-12-15 DIAGNOSIS — N10 Acute tubulo-interstitial nephritis: Principal | ICD-10-CM | POA: Diagnosis present

## 2012-12-15 DIAGNOSIS — R000 Tachycardia, unspecified: Secondary | ICD-10-CM | POA: Diagnosis present

## 2012-12-15 LAB — CBC
Hemoglobin: 12.9 g/dL (ref 12.0–15.0)
Platelets: 141 10*3/uL — ABNORMAL LOW (ref 150–400)
RBC: 3.95 MIL/uL (ref 3.87–5.11)
WBC: 10.6 10*3/uL — ABNORMAL HIGH (ref 4.0–10.5)

## 2012-12-15 LAB — COMPREHENSIVE METABOLIC PANEL
ALT: 35 U/L (ref 0–35)
AST: 26 U/L (ref 0–37)
Alkaline Phosphatase: 155 U/L — ABNORMAL HIGH (ref 39–117)
CO2: 26 mEq/L (ref 19–32)
Calcium: 9.2 mg/dL (ref 8.4–10.5)
Chloride: 96 mEq/L (ref 96–112)
GFR calc non Af Amer: 37 mL/min — ABNORMAL LOW (ref >90)
Glucose, Bld: 109 mg/dL — ABNORMAL HIGH (ref 70–99)
Potassium: 3.8 mEq/L (ref 3.5–5.1)
Sodium: 134 mEq/L — ABNORMAL LOW (ref 135–145)
Total Bilirubin: 0.7 mg/dL (ref 0.3–1.2)

## 2012-12-15 LAB — URINALYSIS, ROUTINE W REFLEX MICROSCOPIC
Ketones, ur: NEGATIVE mg/dL
Specific Gravity, Urine: 1.017 (ref 1.005–1.030)
Urobilinogen, UA: 2 mg/dL — ABNORMAL HIGH (ref 0.0–1.0)

## 2012-12-15 LAB — CREATININE, URINE, RANDOM: Creatinine, Urine: 118.08 mg/dL

## 2012-12-15 LAB — URINE MICROSCOPIC-ADD ON

## 2012-12-15 LAB — SODIUM, URINE, RANDOM: Sodium, Ur: 10 mEq/L

## 2012-12-15 MED ORDER — DOXYCYCLINE HYCLATE 100 MG PO TABS
100.0000 mg | ORAL_TABLET | Freq: Once | ORAL | Status: AC
  Administered 2012-12-15: 100 mg via ORAL
  Filled 2012-12-15: qty 1

## 2012-12-15 MED ORDER — WHITE PETROLATUM GEL
Status: AC
  Administered 2012-12-15: 0.2
  Filled 2012-12-15: qty 5

## 2012-12-15 MED ORDER — DOXYCYCLINE HYCLATE 100 MG PO TABS
100.0000 mg | ORAL_TABLET | Freq: Two times a day (BID) | ORAL | Status: DC
  Administered 2012-12-15 – 2012-12-16 (×2): 100 mg via ORAL
  Filled 2012-12-15 (×3): qty 1

## 2012-12-15 MED ORDER — SODIUM CHLORIDE 0.9 % IV SOLN
INTRAVENOUS | Status: DC
  Administered 2012-12-15 – 2012-12-16 (×5): via INTRAVENOUS

## 2012-12-15 MED ORDER — SODIUM CHLORIDE 0.9 % IJ SOLN
3.0000 mL | Freq: Two times a day (BID) | INTRAMUSCULAR | Status: DC
  Administered 2012-12-15 – 2012-12-19 (×4): 3 mL via INTRAVENOUS

## 2012-12-15 MED ORDER — HEPARIN SODIUM (PORCINE) 5000 UNIT/ML IJ SOLN
5000.0000 [IU] | Freq: Three times a day (TID) | INTRAMUSCULAR | Status: DC
  Administered 2012-12-16: 5000 [IU] via SUBCUTANEOUS
  Filled 2012-12-15 (×11): qty 1

## 2012-12-15 MED ORDER — OXYCODONE HCL 5 MG PO TABS: 5.0000 mg | ORAL_TABLET | ORAL | Status: DC | PRN

## 2012-12-15 MED ORDER — ACETAMINOPHEN 325 MG PO TABS
650.0000 mg | ORAL_TABLET | Freq: Four times a day (QID) | ORAL | Status: DC | PRN
  Administered 2012-12-15 – 2012-12-17 (×5): 650 mg via ORAL
  Filled 2012-12-15 (×4): qty 2

## 2012-12-15 MED ORDER — SODIUM CHLORIDE 0.9 % IV BOLUS (SEPSIS)
1000.0000 mL | Freq: Once | INTRAVENOUS | Status: AC
  Administered 2012-12-15: 1000 mL via INTRAVENOUS

## 2012-12-15 MED ORDER — IBUPROFEN 400 MG PO TABS
400.0000 mg | ORAL_TABLET | Freq: Once | ORAL | Status: AC
  Administered 2012-12-15: 400 mg via ORAL
  Filled 2012-12-15: qty 1

## 2012-12-15 MED ORDER — ACETAMINOPHEN 650 MG RE SUPP: 650.0000 mg | Freq: Four times a day (QID) | RECTAL | Status: DC | PRN

## 2012-12-15 MED ORDER — OXYCODONE-ACETAMINOPHEN 5-325 MG PO TABS: 1.0000 | ORAL_TABLET | ORAL | Status: DC | PRN

## 2012-12-15 MED ORDER — DEXTROSE 5 % IV SOLN
1.0000 g | INTRAVENOUS | Status: DC
  Administered 2012-12-15 – 2012-12-18 (×4): 1 g via INTRAVENOUS
  Filled 2012-12-15 (×5): qty 10

## 2012-12-15 NOTE — ED Notes (Signed)
Patient has arrived in pod c 29 to await admit orders

## 2012-12-15 NOTE — ED Notes (Signed)
Pt has returned from ultrasound.  

## 2012-12-15 NOTE — Progress Notes (Signed)
Unit CM UR Completed by [facility] ED CM  W. Shanera Meske RN  

## 2012-12-15 NOTE — Progress Notes (Signed)
Patient states she is having a fever but when being checked orally temp has been 98.5 at the highest. Patient has been asking if her temp can be checked axillary in which temp would be elevated at 101 but has on several blankets from our warmer that is set at 109 and temp in room is set on 80. Patient was asked if RN can check temp rectally to get the most accurate temp reading and patient states that I need to ask her MD first. Provider on call notified to why RN was wanting to check a rectal temp (to get the most accurate temp). Rectal temp was obtained and was 104.5. MD K was paged again and order for tylenol was given. He also asked if extra blankets could be taken off patient and if heat could be turned off. This was explained to patient and she was compliant. Will continue to monitor.  Marcelyn Bruins RN

## 2012-12-15 NOTE — H&P (Signed)
Date: 12/15/2012               Patient Name:  [name] MRN: [id number]  DOB: [birth year] Age / Sex: 47 y.o., female   PCP: No Pcp Per Patient         Medical Service: Internal Medicine Teaching Service         Attending Physician: Dr. [name]    First Contact: Dr. Garald Braver Pager: [phone]  Second Contact: Dr. Dorise Hiss Pager: [phone]       After Hours (After 5p/  First Contact Pager: [phone]  weekends / holidays): Second Contact Pager: [phone]   Chief Complaint: Fever  History of Present Illness:  [name] is a 47 year old woman with no PMH who presents with fever for 5 days as well as dysuria and back pain. She explains that the back pain is only present when he has rigors from her fever. The fever was as high as 103.52F and subsided with either Ibuprofen or Tylenol. She has taken Ibuprofen 200mg  every 4-6 hours for the past 5 days and Tylenol extra-strength once per day for the past 5 days. She had dog bite to her right hand about 3 weeks ago and was treated with clindamycin x 7days which she finished over one week ago, 3-4 days prior to the start of her fever. The wound was sutured, her Tetanus vaccine is up to date and the dog had been vaccinated for Rabies. Her right hand wound is healing well with no drainage, swelling, or erythema. She did find two ticks on her body today but she states that they had not "bitten" her.   She denies headache, photophobia, blurry vision, neck stiffness, chest pain, shortness of breath, abdominal pain, diarrhea, constipation, hematuria, or N/V.  Meds: Current Facility-Administered Medications  Medication Dose Route Frequency Provider Last Rate Last Dose  . 0.9 %  sodium chloride infusion   Intravenous Continuous Ky Barban, MD 125 mL/hr at 12/15/12 1721    . cefTRIAXone (ROCEPHIN) 1 g in dextrose 5 % 50 mL IVPB  1 g Intravenous Q24H Nicole Pisciotta, PA-C   1 g at 12/15/12 1452   Current Outpatient Prescriptions    Medication Sig Dispense Refill  . ibuprofen (ADVIL,MOTRIN) 800 MG tablet Take 1 tablet (800 mg total) by mouth 3 (three) times daily.  21 tablet  0  . oxyCODONE-acetaminophen (PERCOCET) 5-325 MG per tablet Take 1 tablet by mouth every 4 (four) hours as needed for pain.  18 tablet  0    Allergies: Allergies as of 12/15/2012  . (No Known Allergies)   History reviewed. No pertinent past medical history. Past Surgical History  Procedure Laterality Date  . Cesarian    . Cesarean section     History reviewed. No pertinent family history. History   Social History  . Marital Status: Single    Spouse Name: N/A    Number of Children: N/A  . Years of Education: N/A   Occupational History  . Not on file.   Social History Main Topics  . Smoking status: Current Every Day Smoker -- 1.00 packs/day    Types: Cigarettes  . Smokeless tobacco: Not on file  . Alcohol Use: Yes     Comment: occasional  . Drug Use: Yes    Special: Marijuana  . Sexually Active: Not on file   Other Topics Concern  . Not on file   Social History Narrative  . No narrative on file    Review  of Systems: Pertinent items are noted in HPI.  Physical Exam: Blood pressure 113/63, pulse 103, temperature 98.5 F (36.9 C), temperature source Oral, resp. rate 16, last menstrual period 11/30/2012, SpO2 100.00%. BP 113/63  Pulse 103  Temp(Src) 98.5 F (36.9 C) (Oral)  Resp 16  SpO2 100%  LMP 11/30/2012  General Appearance:    Alert, cooperative, no distress, appears stated age  Head:    Normocephalic, without obvious abnormality, atraumatic  Eyes:    PERRL, conjunctiva/corneas clear     Nose:   Nares normal, no drainage     Throat:   Lips, mucosa, and tongue normal  Neck:   Supple, symmetrical, trachea midline  Back:     Symmetric, no curvature, ROM normal, no CVA tenderness  Lungs:     Clear to auscultation bilaterally, respirations unlabored  Chest Wall:    No tenderness or deformity   Heart:     Tachycardia, Regular rhythm, S1 and S2 normal, no murmur, rub   or gallop     Abdomen:     Soft, non-tender, bowel sounds active all four quadrants,    no masses, no organomegaly        Extremities:   Extremities normal,no cyanosis or edema. A ~5cm well healing wound is noted on the palmar aspect of the medial right hand with no surrounding edema or erythema.  Pulses:   2+ and symmetric all extremities  Skin:   Skin color, texture, turgor normal, no rashes      Neurologic:   Alert and Oriented x3, CNII-XII intact, normal strength, moves 4 extremities voluntarily, normal gait    Lab results: Basic Metabolic Panel:  Recent Labs  40/98/11 1215  NA 134*  K 3.8  CL 96  CO2 26  GLUCOSE 109*  BUN 24*  CREATININE 1.65*  CALCIUM 9.2   Liver Function Tests:  Recent Labs  12/15/12 1215  AST 26  ALT 35  ALKPHOS 155*  BILITOT 0.7  PROT 6.7  ALBUMIN 2.6*   CBC:  Recent Labs  12/15/12 1215  WBC 10.6*  HGB 12.9  HCT 35.1*  MCV 88.9  PLT 141*   Urinalysis:  Recent Labs  12/15/12 1334  COLORURINE AMBER*  LABSPEC 1.017  PHURINE 6.0  GLUCOSEU NEGATIVE  HGBUR SMALL*  BILIRUBINUR SMALL*  KETONESUR NEGATIVE  PROTEINUR 100*  UROBILINOGEN 2.0*  NITRITE POSITIVE*  LEUKOCYTESUR MODERATE*   Imaging results:  Dg Chest 2 View  12/15/2012   *RADIOLOGY REPORT*  Clinical Data: Shortness of breath and fever  CHEST - 2 VIEW  Comparison: None.  Findings: Normal cardiac silhouette and mediastinal contours.  The lungs remain hyperexpanded with flattening of the bilateral hemidiaphragms and mild diffuse thickening of the pulmonary interstitium.  No focal airspace opacities.  No pleural effusion or pneumothorax.  No definite evidence of edema.  Unchanged bones.  IMPRESSION:  Hyperexpanded lungs without acute cardiopulmonary disease.   Original Report Authenticated By: Tacey Ruiz, MD   US Renal  12/15/2012   *RADIOLOGY REPORT*  Clinical Data: Urinary tract infection, evaluate for  hydronephrosis  RENAL/URINARY TRACT ULTRASOUND COMPLETE  Comparison:  None.  Findings:  Right Kidney:  Normal cortical thickness, echogenicity and size, measuring 13.1 cm in length.  No focal renal lesions.  No echogenic renal stones.  No urinary obstruction.  Left Kidney:  Normal cortical thickness, echogenicity and size, measuring 4.0 cm in length.  No focal renal lesions.  No echogenic renal stones.  No urinary obstruction.  Bladder:  Normal given  degree distension.  IMPRESSION: No evidence of urinary obstruction.   Original Report Authenticated By: Tacey Ruiz, MD    Assessment & Plan by Problem:  Acute renal failure: Her baseline creatinine is ~0.7. This is likely multifactorial from increased insensible losses from her fever, coupled with use of Ibuprofen and UTI with increased urinary frequency. Bilateral renal US is unremarkable for hydronephrosis.  -Observation with telemetry -NS @ 122ml/hr -Urine Na and urine Cr for FeNa calculation -Avoid NSAIDs  UTI: She has dysuria, increased urinary frequency, and her UA is remarkable for pyuria, moderate leucocytes, nitrite positive, and few bacteria. Urine culture is pending. He received Rocephin in the ED.  -Continue Rocephin  -f/u urine culture.   Fever: Likely etiology is UTI but tick-borne illness also possible given recent exposure to two ticks, fever with high temperatures for 5 days, and mild thrombocytopaenia.  -Doxycycline 100mg  BID -Continue to monitor fever curve  Tachycardia: Intermittent, HR in low 100s, improved with IV fluids.   DVT prophylaxis: heparin TID.   FEN: NS @125ml /hr BMET in AM Regular diet   Dispo: Disposition is deferred at this time, awaiting improvement of current medical problems. Anticipated discharge in approximately 1-2 day(s).   The patient does not have a current PCP and does need an [facility] hospital follow-up appointment after discharge.  The patient does not have transportation limitations that  hinder transportation to clinic appointments.   Signed: Ky Barban, MD 12/15/2012, 5:43 PM

## 2012-12-15 NOTE — ED Notes (Signed)
Meal ordered for pt 

## 2012-12-15 NOTE — ED Notes (Signed)
paitent has gone for her renal ultrasound

## 2012-12-15 NOTE — ED Notes (Signed)
Report called to sarah on 5500

## 2012-12-15 NOTE — ED Notes (Signed)
Pt reports fever at home 103.9 since last Thursday. Pt c/o epigastric pain, back pain and dysuria. Pt also reports she noticed at tick this AM on her which she has since removed. Pt alert, oriented x4, speaking in full sentences. NAd at present.

## 2012-12-15 NOTE — Progress Notes (Signed)
Patient does not want to have any needle sticks and does not want to take heparin. It was explained that heparin was to help decrease the chances of developing a DVT. Patient still refused. Documented as not given.   Jamison Neighbor RN

## 2012-12-15 NOTE — ED Provider Notes (Signed)
History     CSN: [id number]  Arrival date & time 12/15/12  1116   First MD Initiated Contact with Patient 12/15/12 1338      Chief Complaint  Patient presents with  . Fever    (Consider location/radiation/quality/duration/timing/severity/associated sxs/prior treatment) HPI  GENICE KIMBERLIN is a 47 y.o. female complaining of 5 days of fever (Tmax 103.9) associated with urinary frequency, epigastric pain, back pain and dysuria and clear rhinorrhea and headache. Patient also states that she found a tick that was "getting ready to bite her" this a.m. Patient does not have any known tick bites or rashes, she denies cough, neck stiffness, chest pain, abdominal pain, nausea vomiting, change in bowel habits.     History reviewed. No pertinent past medical history.  Past Surgical History  Procedure Laterality Date  . Cesarian    . Cesarean section      History reviewed. No pertinent family history.  History  Substance Use Topics  . Smoking status: Current Every Day Smoker -- 1.00 packs/day    Types: Cigarettes  . Smokeless tobacco: Not on file  . Alcohol Use: Yes     Comment: occasional    OB History   Grav Para Term Preterm Abortions TAB SAB Ect Mult Living                  Review of Systems  Constitutional: Positive for fever.  Respiratory: Negative for shortness of breath.   Cardiovascular: Negative for chest pain.  Gastrointestinal: Negative for nausea, vomiting, abdominal pain and diarrhea.  Genitourinary: Positive for dysuria and frequency. Negative for flank pain.  Neurological: Positive for headaches.  All other systems reviewed and are negative.    Allergies  Review of patient's allergies indicates no known allergies.  Home Medications   Current Outpatient Rx  Name  Route  Sig  Dispense  Refill  . ibuprofen (ADVIL,MOTRIN) 800 MG tablet   Oral   Take 1 tablet (800 mg total) by mouth 3 (three) times daily.   21 tablet   0   .  oxyCODONE-acetaminophen (PERCOCET) 5-325 MG per tablet   Oral   Take 1 tablet by mouth every 4 (four) hours as needed for pain.   18 tablet   0     BP 118/67  Pulse 108  Temp(Src) 98.5 F (36.9 C) (Oral)  Resp 14  SpO2 100%  LMP 11/30/2012  Physical Exam  Nursing note and vitals reviewed. Constitutional: She is oriented to person, place, and time. She appears well-developed and well-nourished. No distress.  HENT:  Head: Normocephalic and atraumatic.  DRy MM  Eyes: Conjunctivae and EOM are normal. Pupils are equal, round, and reactive to light.  Neck: Normal range of motion. Neck supple.  Cardiovascular: Normal rate.   Pulmonary/Chest: Effort normal and breath sounds normal. No stridor. No respiratory distress. She has no wheezes. She has no rales. She exhibits no tenderness.  Abdominal: Soft. Bowel sounds are normal. She exhibits no distension. There is no tenderness. There is no rebound and no guarding.  Genitourinary:  No CVA tenderness bilaterally  Musculoskeletal: Normal range of motion.  Patient has well-healing laceration to palmar aspects of right fifth digit, no induration, warmth, discharge, signs of infection. Patient has significantly reduced range of motion in both flexion and extension. Cap refill is less than 2 seconds and distal sensation is grossly intact.  Neurological: She is alert and oriented to person, place, and time.  Psychiatric: She has a normal mood and affect.  ED Course  Procedures (including critical care time)  Labs Reviewed  CBC - Abnormal; Notable for the following:    WBC 10.6 (*)    HCT 35.1 (*)    MCHC 36.8 (*)    Platelets 141 (*)    All other components within normal limits  COMPREHENSIVE METABOLIC PANEL - Abnormal; Notable for the following:    Sodium 134 (*)    Glucose, Bld 109 (*)    BUN 24 (*)    Creatinine, Ser 1.65 (*)    Albumin 2.6 (*)    Alkaline Phosphatase 155 (*)    GFR calc non Af Amer 37 (*)    GFR calc Af Amer  43 (*)    All other components within normal limits  URINALYSIS, ROUTINE W REFLEX MICROSCOPIC - Abnormal; Notable for the following:    Color, Urine AMBER (*)    APPearance CLOUDY (*)    Hgb urine dipstick SMALL (*)    Bilirubin Urine SMALL (*)    Protein, ur 100 (*)    Urobilinogen, UA 2.0 (*)    Nitrite POSITIVE (*)    Leukocytes, UA MODERATE (*)    All other components within normal limits  URINE MICROSCOPIC-ADD ON - Abnormal; Notable for the following:    Bacteria, UA FEW (*)    All other components within normal limits  URINE CULTURE   Dg Chest 2 View  12/15/2012   *RADIOLOGY REPORT*  Clinical Data: Shortness of breath and fever  CHEST - 2 VIEW  Comparison: None.  Findings: Normal cardiac silhouette and mediastinal contours.  The lungs remain hyperexpanded with flattening of the bilateral hemidiaphragms and mild diffuse thickening of the pulmonary interstitium.  No focal airspace opacities.  No pleural effusion or pneumothorax.  No definite evidence of edema.  Unchanged bones.  IMPRESSION:  Hyperexpanded lungs without acute cardiopulmonary disease.   Original Report Authenticated By: Tacey Ruiz, MD     1. UTI (lower urinary tract infection)   2. Fever   3. AKI (acute kidney injury)       MDM   Filed Vitals:   12/15/12 1325 12/15/12 1328 12/15/12 1345 12/15/12 1430  BP: 146/100  118/67 104/62  Pulse: 118  108 118  Temp: 98.5 F (36.9 C) 98.5 F (36.9 C)    TempSrc: Oral Oral    Resp: 14     SpO2: 96%  100% 98%     KATIEJO GILROY is a 47 y.o. female febrile for 5 days with back pain and dysuria. Does not appear septic. Patient has a very mild leukocytosis of 10.6. She is clinically dehydrated and tachycardic in the 130s. Urinalysis is consistent with infection I will start her on a gram of Rocephin through the IV. This patient also has take exposure it is reasonable to start her on doxycycline I will send her to [facility] spotted fever and Lyme titers as well.  Patient has acute kidney injury with a creatinine of 1.65.  Pt will be admitted to Dr. Modena Jansky of internal medicine.   Medications  cefTRIAXone (ROCEPHIN) 1 g in dextrose 5 % 50 mL IVPB (not administered)  ibuprofen (ADVIL,MOTRIN) tablet 400 mg (not administered)  doxycycline (VIBRA-TABS) tablet 100 mg (not administered)  sodium chloride 0.9 % bolus 1,000 mL (1,000 mLs Intravenous New Bag/Given 12/15/12 1440)     Wynetta Emery, PA-C 12/15/12 1504

## 2012-12-16 ENCOUNTER — Observation Stay (HOSPITAL_COMMUNITY): Payer: Medicaid Other

## 2012-12-16 LAB — CBC
HCT: 34 % — ABNORMAL LOW (ref 36.0–46.0)
MCV: 88.5 fL (ref 78.0–100.0)
RBC: 3.84 MIL/uL — ABNORMAL LOW (ref 3.87–5.11)
RDW: 13.4 % (ref 11.5–15.5)
WBC: 11.3 10*3/uL — ABNORMAL HIGH (ref 4.0–10.5)

## 2012-12-16 LAB — COMPREHENSIVE METABOLIC PANEL
Albumin: 2.2 g/dL — ABNORMAL LOW (ref 3.5–5.2)
Alkaline Phosphatase: 185 U/L — ABNORMAL HIGH (ref 39–117)
BUN: 20 mg/dL (ref 6–23)
Potassium: 3.3 mEq/L — ABNORMAL LOW (ref 3.5–5.1)
Total Protein: 6.2 g/dL (ref 6.0–8.3)

## 2012-12-16 LAB — ROCKY MTN SPOTTED FVR AB, IGM-BLOOD: RMSF IgM: 0.44 IV (ref 0.00–0.89)

## 2012-12-16 MED ORDER — IOHEXOL 300 MG/ML  SOLN
25.0000 mL | INTRAMUSCULAR | Status: AC
  Administered 2012-12-16 (×2): 25 mL via ORAL

## 2012-12-16 MED ORDER — DOXYCYCLINE HYCLATE 100 MG PO TABS
100.0000 mg | ORAL_TABLET | Freq: Two times a day (BID) | ORAL | Status: DC
  Administered 2012-12-16: 100 mg via ORAL
  Filled 2012-12-16 (×3): qty 1

## 2012-12-16 NOTE — H&P (Addendum)
Internal Medicine Teaching Service Attending Note Date: 12/16/2012  Patient name: [name]  Medical record number: [id number]  Date of birth: Oct 25, 1965   I have read the H&P by Dr. Garald Braver and I agree with her exam, findings and assessment. In short, this is a 47 year old lady, admitted with a brief history of dysuria and frequency for 5 days, pelvic pain, and fever that did not get better. She is found to have a UTI, which is being treated by ceftriaxone. She spiked a fever of 104.5 F yesterday night and blood cultures were sent this morning. She spiked a fever of 101 this morning. She also gives some history of removal of ticks from her body yesterday. She does not know how long the ticks were present on her body. Recent history of dog bite in hand with clindamycin treatment.   She has no past medical history on file. She has been taking tylenol for her fever. She has had no such episode before. She is a smoker.   Filed Vitals:   12/15/12 2335 12/16/12 0157 12/16/12 0537 12/16/12 0700  BP:   100/63   Pulse:   75   Temp: 104.5 F (40.3 C) 100.3 F (37.9 C) 98.2 F (36.8 C) 100 F (37.8 C)  TempSrc: Rectal Rectal Oral Rectal  Resp:   16   Height:      Weight:      SpO2:   98%    General: She is lying in bed, no acute distress.  HEENT: PERRL, EOMI, no icterus. Heart: S1S2 RRR, no murmur Lungs: Clear to auscultation Abdomen: lower abdomen, mildly tender, mild flank pain both sides on percussion. Extremities: No pedal edema.     Recent Labs Lab 12/15/12 1215 12/16/12 0637  HGB 12.9 12.3  HCT 35.1* 34.0*  WBC 10.6* 11.3*  PLT 141* 152    Recent Labs Lab 12/15/12 1215 12/16/12 0637  NA 134* 138  K 3.8 3.3*  CL 96 104  CO2 26 22  GLUCOSE 109* 107*  BUN 24* 20  CREATININE 1.65* 1.22*  CALCIUM 9.2 8.6    Recent Labs Lab 12/15/12 1215 12/16/12 0637  AST 26 44*  ALT 35 48*  ALKPHOS 155* 185*  BILITOT 0.7 0.7  PROT 6.7 6.2  ALBUMIN 2.6* 2.2*    Urinalysis    Component Value Date/Time   COLORURINE AMBER* 12/15/2012 1334   APPEARANCEUR CLOUDY* 12/15/2012 1334   LABSPEC 1.017 12/15/2012 1334   PHURINE 6.0 12/15/2012 1334   GLUCOSEU NEGATIVE 12/15/2012 1334   HGBUR SMALL* 12/15/2012 1334   BILIRUBINUR SMALL* 12/15/2012 1334   KETONESUR NEGATIVE 12/15/2012 1334   PROTEINUR 100* 12/15/2012 1334   UROBILINOGEN 2.0* 12/15/2012 1334   NITRITE POSITIVE* 12/15/2012 1334   LEUKOCYTESUR MODERATE* 12/15/2012 1334    USG report reviewed. Negative hydronephrosis.  . cefTRIAXone (ROCEPHIN)  IV  1 g Intravenous Q24H  . doxycycline  100 mg Oral Q12H  . heparin  5,000 Units Subcutaneous Q8H  . iohexol  25 mL Oral Q1 Hr x 2  . sodium chloride  3 mL Intravenous Q12H    Assessment and Plan   Complicated UTI, likely pyelonephritis with acute renal failure - IV rocephin daily, CT without contrast abdomen pelvis to be done, blood and urine cultures pending. USG reviewed. No obstruction or hydronephrosis. Continue hydration, creatinine improving.   Addendum Urine culture growing EColi just back, sensitivities pending. CT abd pelv just reported: suggestive of pyelonephritis  Tick exposure -She has had recent  tick exposure (removed ticks yesterday from her body does not know how long they were there), however, she does not exhibit any signs of Lyme's disease. Treatment or serologic testing for this disease is not recommended merely based on history of tick exposure. I initially thought that we will discontinue doxycycline for this reason. However, RMSF might be a remote possibility that could present with fevers in the first five days of tick exposure. Although in this case, it looks like the presentation is likely secondary to complicated UTI, given the low risk benefit ratio for treatment, I would err on the side of continuing therapy with doxycycline 100 mg BID as well. It is still too early to send out serology.  Liver markers trending up. This could be  related or unrelated to tick exposure. We would need close observation of the patient and the liver markers to decide.     Rest agree with resident note    [name] 12/16/2012, 12:08 PM.

## 2012-12-16 NOTE — ED Provider Notes (Signed)
Medical screening examination/treatment/procedure(s) were performed by non-physician practitioner and as supervising physician I was immediately available for consultation/collaboration.   Carleene Cooper III, MD 12/16/12 1116

## 2012-12-16 NOTE — Progress Notes (Signed)
Subjective: Pt reports slowly rising fever last night that lasted 4 hours with a Tmax of 104.5. She stated that RN did not give her medication until temperature rose that high. She had associated chills, back pain, and abdominal pain. Fever subsided after receiving Tylenol. She was eating ice chips on evaluation this morning which she reported was to prevent another fever. She has been drinking fluids but has had a limited appetite.    When asked about the ticks she found, she stated that both were on her legs when she pulled them off. She is not sure how long they were there but says she's been shaving and doesn't think it could've been for long.    Objective: Vital signs in last 24 hours: Filed Vitals:   12/15/12 2335 12/16/12 0157 12/16/12 0537 12/16/12 0700  BP:   100/63   Pulse:   75   Temp: 104.5 F (40.3 C) 100.3 F (37.9 C) 98.2 F (36.8 C) 100 F (37.8 C)  TempSrc: Rectal Rectal Oral Rectal  Resp:   16   Height:      Weight:      SpO2:   98%    General: Alert and cooperative but appears mildly anxious, NAD lying in bed Head: Normocephalic, atraumatic Eyes: EOMI, conjunctiva/corneas clear Lungs: CTAB, nonlabored respirations Heart: RRR, no murmurs appreciated Abdomen: Normal bowel sounds, soft, nontender Back: No CVA tenderness Skin: No visible rashes  Lab Results: Basic Metabolic Panel:  Recent Labs Lab 12/15/12 1215 12/16/12 0637  NA 134* 138  K 3.8 3.3*  CL 96 104  CO2 26 22  GLUCOSE 109* 107*  BUN 24* 20  CREATININE 1.65* 1.22*  CALCIUM 9.2 8.6   Liver Function Tests:  Recent Labs Lab 12/15/12 1215 12/16/12 0637  AST 26 44*  ALT 35 48*  ALKPHOS 155* 185*  BILITOT 0.7 0.7  PROT 6.7 6.2  ALBUMIN 2.6* 2.2*   CBC:  Recent Labs Lab 12/15/12 1215 12/16/12 0637  WBC 10.6* 11.3*  HGB 12.9 12.3  HCT 35.1* 34.0*  MCV 88.9 88.5  PLT 141* 152   Urinalysis:  Recent Labs Lab 12/15/12 1334  COLORURINE AMBER*  LABSPEC 1.017  PHURINE 6.0    GLUCOSEU NEGATIVE  HGBUR SMALL*  BILIRUBINUR SMALL*  KETONESUR NEGATIVE  PROTEINUR 100*  UROBILINOGEN 2.0*  NITRITE POSITIVE*  LEUKOCYTESUR MODERATE*   Misc. Labs: Urine Na 10 Urine Creatinine 118.08 Calculated FENa 0.1% Blood cultures x2 pending Urine culture pending  Studies/Results: Dg Chest 2 View  12/15/2012   *RADIOLOGY REPORT*  Clinical Data: Shortness of breath and fever  CHEST - 2 VIEW  Comparison: None.  Findings: Normal cardiac silhouette and mediastinal contours.  The lungs remain hyperexpanded with flattening of the bilateral hemidiaphragms and mild diffuse thickening of the pulmonary interstitium.  No focal airspace opacities.  No pleural effusion or pneumothorax.  No definite evidence of edema.  Unchanged bones.  IMPRESSION:  Hyperexpanded lungs without acute cardiopulmonary disease.   Original Report Authenticated By: Tacey Ruiz, MD   US Renal  12/15/2012   **ADDENDUM** CREATED: 12/15/2012 17:53:55  The following is a correction to report the findings of the left kidney.  Left kidney:  Normal cortical thickness, echogenicity and size, measuring 11.8 cm in length.  No focal renal lesions.  No echogenic renal stones.  No urinary obstruction.  **END ADDENDUM** SIGNED BY: Dyanne Carrel, MD  12/15/2012   *RADIOLOGY REPORT*  Clinical Data: Urinary tract infection, evaluate for hydronephrosis  RENAL/URINARY TRACT ULTRASOUND  COMPLETE  Comparison:  None.  Findings:  Right Kidney:  Normal cortical thickness, echogenicity and size, measuring 13.1 cm in length.  No focal renal lesions.  No echogenic renal stones.  No urinary obstruction.  Left Kidney:  Normal cortical thickness, echogenicity and size, measuring 4.0 cm in length.  No focal renal lesions.  No echogenic renal stones.  No urinary obstruction.  Bladder:  Normal given degree distension.  IMPRESSION: No evidence of urinary obstruction.   Original Report Authenticated By: Tacey Ruiz, MD   Medications: I have  reviewed the patient's current medications. Scheduled Meds: . cefTRIAXone (ROCEPHIN)  IV  1 g Intravenous Q24H  . doxycycline  100 mg Oral Q12H  . heparin  5,000 Units Subcutaneous Q8H  . sodium chloride  3 mL Intravenous Q12H   Continuous Infusions: . sodium chloride 125 mL/hr at 12/16/12 0546   PRN Meds:.acetaminophen, acetaminophen, oxyCODONE   Assessment/Plan: [name] is a 47 yo F who presented with fever for 5 days, dysuria, and back pain. She had Tmax of 104.5 last night but otherwise stable.   Acute renal failure: Her baseline creatinine is ~0.7. On admission her creatinine was 1.65 but has decreased to 1.22 this morning after receiving IVF. Etiology of ARF is likely multifactorial from increased insensible losses from her fever, Ibuprofen use, and UTI. FeNa indicates prerenal process. Bilateral renal US is unremarkable for hydronephrosis or obstructive process.  -Observation with telemetry  -NS @ 149ml/hr  -Avoid NSAIDs  -BMET tomorrow morning  UTI: She has dysuria, increased urinary frequency, and her UA is remarkable for pyuria, moderate leucocytes, nitrite positive, and few bacteria. Urine culture is pending. She received Rocephin in the ED. Given her Tmax of 104.5 last night there is concern for possible progression to pyelonephritis so we will obtain imaging.    -Continue Rocephin  -F/u urine culture and blood cultures -CT Abd/Pelv today   Fever: Likely etiology is UTI but tick-borne illness is also on differential given her recent exposure to two ticks, fever with high temperatures for 5 days, mild thrombocytopenia, and mildly elevated LFTs and alk phos.  -Doxycycline 100mg  BID  -Continue to monitor fever curve  -Tylenol 650mg  q6hr PRN  DVT prophylaxis: heparin TID.   Dispo: Disposition is deferred at this time, awaiting improvement of current medical problems. Anticipated discharge in approximately 1-2 day(s).  The patient does not have a current PCP and does  need an [facility] hospital follow-up appointment after discharge.  The patient does not have transportation limitations that hinder transportation to clinic appointments.   This is a Psychologist, occupational Note.  The care of the patient was discussed with Dr. Garald Braver, Dr. Dorise Hiss, and Dr. Dalphine Handing and the assessment and plan formulated with their assistance.  Please see their attached note for official documentation of the daily encounter.   LOS: 1 day   Kerrie Pleasure, Med Student 12/16/2012, 9:36 AM

## 2012-12-16 NOTE — Progress Notes (Signed)
Subjective: She had a fever last night at 2300 with Tmax of 104.50F (rectal) with rigors, back pain, abdominal pain, and headache. She denies shortness of breath or chest pain. She continues to have dysuria and increased urinary frequency.   Objective: Vital signs in last 24 hours: Filed Vitals:   12/16/12 0157 12/16/12 0537 12/16/12 0700 12/16/12 1304  BP:  100/63  131/77  Pulse:  75  84  Temp: 100.3 F (37.9 C) 98.2 F (36.8 C) 100 F (37.8 C) 98.3 F (36.8 C)  TempSrc: Rectal Oral Rectal Oral  Resp:  16  20  Height:      Weight:      SpO2:  98%  97%   Weight change:   Intake/Output Summary (Last 24 hours) at 12/16/12 1436 Last data filed at 12/16/12 0900  Gross per 24 hour  Intake    240 ml  Output      0 ml  Net    240 ml   Vitals reviewed. General: resting in bed, in NAD HEENT:  no scleral icterus Cardiac: RRR, no rubs, murmurs or gallops Pulm: clear to auscultation bilaterally, no wheezes, rales, or rhonchi Abd: soft, nontender, nondistended, BS present. Right CVA tenderness (which is new) Ext: warm and well perfused, no pedal edema Neuro: alert and oriented X3, cranial nerves II-XII grossly intact, strength and sensation to light touch equal in bilateral upper and lower extremities  Lab Results: Basic Metabolic Panel:  Recent Labs Lab 12/15/12 1215 12/16/12 0637  NA 134* 138  K 3.8 3.3*  CL 96 104  CO2 26 22  GLUCOSE 109* 107*  BUN 24* 20  CREATININE 1.65* 1.22*  CALCIUM 9.2 8.6   Liver Function Tests:  Recent Labs Lab 12/15/12 1215 12/16/12 0637  AST 26 44*  ALT 35 48*  ALKPHOS 155* 185*  BILITOT 0.7 0.7  PROT 6.7 6.2  ALBUMIN 2.6* 2.2*   CBC:  Recent Labs Lab 12/15/12 1215 12/16/12 0637  WBC 10.6* 11.3*  HGB 12.9 12.3  HCT 35.1* 34.0*  MCV 88.9 88.5  PLT 141* 152   Urinalysis:  Recent Labs Lab 12/15/12 1334  COLORURINE AMBER*  LABSPEC 1.017  PHURINE 6.0  GLUCOSEU NEGATIVE  HGBUR SMALL*  BILIRUBINUR SMALL*  KETONESUR  NEGATIVE  PROTEINUR 100*  UROBILINOGEN 2.0*  NITRITE POSITIVE*  LEUKOCYTESUR MODERATE*    Micro Results: Recent Results (from the past 240 hour(s))  URINE CULTURE     Status: None   Collection Time    12/15/12  1:34 PM      Result Value Range Status   Specimen Description URINE, CLEAN CATCH   Final   Special Requests NONE   Final   Culture  Setup Time 12/15/2012 17:47   Final   Colony Count >=100,000 COLONIES/ML   Final   Culture ESCHERICHIA COLI   Final   Report Status PENDING   Incomplete   Studies/Results: Dg Chest 2 View  12/15/2012   *RADIOLOGY REPORT*  Clinical Data: Shortness of breath and fever  CHEST - 2 VIEW  Comparison: None.  Findings: Normal cardiac silhouette and mediastinal contours.  The lungs remain hyperexpanded with flattening of the bilateral hemidiaphragms and mild diffuse thickening of the pulmonary interstitium.  No focal airspace opacities.  No pleural effusion or pneumothorax.  No definite evidence of edema.  Unchanged bones.  IMPRESSION:  Hyperexpanded lungs without acute cardiopulmonary disease.   Original Report Authenticated By: Tacey Ruiz, MD   US Renal  12/15/2012   **ADDENDUM**  CREATED: 12/15/2012 17:53:55  The following is a correction to report the findings of the left kidney.  Left kidney:  Normal cortical thickness, echogenicity and size, measuring 11.8 cm in length.  No focal renal lesions.  No echogenic renal stones.  No urinary obstruction.  **END ADDENDUM** SIGNED BY: Dyanne Carrel, MD  12/15/2012   *RADIOLOGY REPORT*  Clinical Data: Urinary tract infection, evaluate for hydronephrosis  RENAL/URINARY TRACT ULTRASOUND COMPLETE  Comparison:  None.  Findings:  Right Kidney:  Normal cortical thickness, echogenicity and size, measuring 13.1 cm in length.  No focal renal lesions.  No echogenic renal stones.  No urinary obstruction.  Left Kidney:  Normal cortical thickness, echogenicity and size, measuring 4.0 cm in length.  No focal renal lesions.   No echogenic renal stones.  No urinary obstruction.  Bladder:  Normal given degree distension.  IMPRESSION: No evidence of urinary obstruction.   Original Report Authenticated By: Tacey Ruiz, MD   Medications: I have reviewed the patient's current medications. Scheduled Meds: . cefTRIAXone (ROCEPHIN)  IV  1 g Intravenous Q24H  . heparin  5,000 Units Subcutaneous Q8H  . sodium chloride  3 mL Intravenous Q12H   Continuous Infusions: . sodium chloride 125 mL/hr at 12/16/12 1253   PRN Meds:.acetaminophen, acetaminophen, oxyCODONE Assessment/Plan: Acute renal failure: Her creatinine is improving, to 1.2 today from 1.6 on admission. FeNa of 0.1%, consistent with prerenal cause.  -Continue NS @ 167ml/hr  -Avoid NSAIDs   UTI: Urine culture grew E.coli, susceptibility pending.  -Continue Rocephin    Fever: Likely etiology is UTI but tick-borne illness also possible given recent exposure to two ticks, fever with high temperatures for 5 days, and mild thrombocytopaenia.  -Doxycycline 100mg  BID  -Continue to monitor fever curve   Tachycardia: Intermittent, HR in low 100s, improved with IV fluids.   DVT prophylaxis: heparin TID.  FEN:  NS @125ml /hr  BMET in AM  Regular diet   Dispo: Disposition is deferred at this time, awaiting improvement of current medical problems.  Anticipated discharge in approximately 1-2 day(s).   The patient does not have a current PCP and does need an [facility] hospital follow-up appointment after discharge.  The patient does not have transportation limitations that hinder transportation to clinic appointments.  .Services Needed at time of discharge: Y = Yes, Blank = No PT:   OT:   RN:   Equipment:   Other:     LOS: 1 day   Ky Barban, MD 12/16/2012, 2:36 PM

## 2012-12-17 DIAGNOSIS — N10 Acute tubulo-interstitial nephritis: Secondary | ICD-10-CM | POA: Diagnosis present

## 2012-12-17 LAB — COMPREHENSIVE METABOLIC PANEL
Alkaline Phosphatase: 173 U/L — ABNORMAL HIGH (ref 39–117)
BUN: 15 mg/dL (ref 6–23)
CO2: 22 mEq/L (ref 19–32)
Chloride: 106 mEq/L (ref 96–112)
GFR calc Af Amer: 72 mL/min — ABNORMAL LOW (ref >90)
Glucose, Bld: 100 mg/dL — ABNORMAL HIGH (ref 70–99)
Potassium: 3.2 mEq/L — ABNORMAL LOW (ref 3.5–5.1)
Total Bilirubin: 0.5 mg/dL (ref 0.3–1.2)

## 2012-12-17 LAB — URINE CULTURE

## 2012-12-17 LAB — CBC WITH DIFFERENTIAL/PLATELET
Basophils Relative: 0 % (ref 0–1)
Eosinophils Absolute: 0.1 10*3/uL (ref 0.0–0.7)
MCHC: 35.6 g/dL (ref 30.0–36.0)
MCV: 89.5 fL (ref 78.0–100.0)
Monocytes Absolute: 1 10*3/uL (ref 0.1–1.0)
Monocytes Relative: 9 % (ref 3–12)
RBC: 3.54 MIL/uL — ABNORMAL LOW (ref 3.87–5.11)
WBC: 10.5 10*3/uL (ref 4.0–10.5)

## 2012-12-17 MED ORDER — ZOLPIDEM TARTRATE 5 MG PO TABS: 5.0000 mg | ORAL_TABLET | Freq: Every evening | ORAL | Status: DC | PRN

## 2012-12-17 MED ORDER — POTASSIUM CHLORIDE CRYS ER 20 MEQ PO TBCR
40.0000 meq | EXTENDED_RELEASE_TABLET | Freq: Two times a day (BID) | ORAL | Status: DC
  Administered 2012-12-17 – 2012-12-19 (×5): 40 meq via ORAL
  Filled 2012-12-17 (×4): qty 2
  Filled 2012-12-17: qty 1
  Filled 2012-12-17 (×2): qty 2

## 2012-12-17 MED ORDER — DIPHENHYDRAMINE HCL 25 MG PO CAPS: 25.0000 mg | ORAL_CAPSULE | Freq: Four times a day (QID) | ORAL | Status: DC | PRN

## 2012-12-17 NOTE — Plan of Care (Signed)
Problem: Phase III Progression Outcomes Goal: Pain controlled on oral analgesia Outcome: Not Applicable Date Met:  12/17/12 Pt has no pain

## 2012-12-17 NOTE — Care Management Note (Signed)
    Page 1 of 1   12/19/2012     2:20:38 PM   CARE MANAGEMENT NOTE 12/19/2012  Patient:  [name], [name]   Account Number:  [id number]  Date Initiated:  12/17/2012  Documentation initiated by:  Letha Cape  Subjective/Objective Assessment:   dx arf, fever, pyelo  admit- lives with spouse, pta indep.     Action/Plan:   Anticipated DC Date:  12/19/2012   Anticipated DC Plan:  HOME/SELF CARE      DC Planning Services  CM consult  Follow-up appt scheduled      Choice offered to / List presented to:             Status of service:  Completed, signed off Medicare Important Message given?   (If response is "NO", the following Medicare IM given date fields will be blank) Date Medicare IM given:   Date Additional Medicare IM given:    Discharge Disposition:  HOME/SELF CARE  Per UR Regulation:  Reviewed for med. necessity/level of care/duration of stay  If discussed at Long Length of Stay Meetings, dates discussed:    Comments:  620/14 14:14 Letha Cape RN, BSN [phone] patient dc to home today, per RN, patient only had cipro which is on the $4 list and 4 doxy tabs which [facility] stated would be $10.96.  Patient informed the RN that she could afford to get her medications.  Patient has f/u appt with [facility] along with the paperwork to fill out for eligibility for the orange card.  12/17/12 11:12 Letha Cape RN, BSN [phone] patient lives with spouse, pta indep.  Patient states she maybe will need help with medicaitons.  Patient has hosp f/u  at the [facility] on 6/26 at 10:15.  NCM will continue to follow for dc needs.

## 2012-12-17 NOTE — Progress Notes (Signed)
Subjective: [name] states she is feeling better this morning. She denies back pain, headache, and abdominal pain. Her dysuria has also improved. She had 2 fevers over the past 24 hrs of 103.1 yesterday afternoon and 101.1 at midnight, both of which resolved with Tylenol. She had difficulty sleeping last night and would like to go home today.   Objective: Vital signs in last 24 hours: Filed Vitals:   12/16/12 2350 12/17/12 0118 12/17/12 0502 12/17/12 1041  BP:   108/65   Pulse:   58   Temp: 101.1 F (38.4 C) 98.2 F (36.8 C) 98 F (36.7 C) 100 F (37.8 C)  TempSrc: Rectal  Oral Rectal  Resp:   16   Height:      Weight:      SpO2:   98%     General: Alert and cooperative but appears mildly anxious, NAD lying in bed  Head: Normocephalic, atraumatic  Eyes: EOMI, conjunctiva/corneas clear  Lungs: CTAB, nonlabored respirations  Heart: RRR, no murmurs appreciated  Abdomen: Normal bowel sounds, soft, nontender  Back: No CVA tenderness  Skin: No visible rashes   Lab Results: Basic Metabolic Panel:  Recent Labs Lab 12/16/12 0637 12/17/12 0624  NA 138 137  K 3.3* 3.2*  CL 104 106  CO2 22 22  GLUCOSE 107* 100*  BUN 20 15  CREATININE 1.22* 1.07  CALCIUM 8.6 8.4   Liver Function Tests:  Recent Labs Lab 12/16/12 0637 12/17/12 0624  AST 44* 36  ALT 48* 52*  ALKPHOS 185* 173*  BILITOT 0.7 0.5  PROT 6.2 5.7*  ALBUMIN 2.2* 2.0*   CBC:  Recent Labs Lab 12/16/12 0637 12/17/12 0624  WBC 11.3* 10.5  NEUTROABS  --  8.6*  HGB 12.3 11.3*  HCT 34.0* 31.7*  MCV 88.5 89.5  PLT 152 164    Urinalysis:  Recent Labs Lab 12/15/12 1334  COLORURINE AMBER*  LABSPEC 1.017  PHURINE 6.0  GLUCOSEU NEGATIVE  HGBUR SMALL*  BILIRUBINUR SMALL*  KETONESUR NEGATIVE  PROTEINUR 100*  UROBILINOGEN 2.0*  NITRITE POSITIVE*  LEUKOCYTESUR MODERATE*   Misc. Labs: Urine Na 10  Urine Creatinine 118.08  Calculated FENa 0.1%  Blood cultures neg x1 day Urine culture with  pan sensitive E. Coli RMSF IgG and IgM negative   Micro Results: Recent Results (from the past 240 hour(s))  URINE CULTURE     Status: None   Collection Time    12/15/12  1:34 PM      Result Value Range Status   Specimen Description URINE, CLEAN CATCH   Final   Special Requests NONE   Final   Culture  Setup Time 12/15/2012 17:47   Final   Colony Count >=100,000 COLONIES/ML   Final   Culture ESCHERICHIA COLI   Final   Report Status 12/17/2012 FINAL   Final   Organism ID, Bacteria ESCHERICHIA COLI   Final  CULTURE, BLOOD (ROUTINE X 2)     Status: None   Collection Time    12/16/12  6:37 AM      Result Value Range Status   Specimen Description BLOOD RIGHT ARM   Final   Special Requests BOTTLES DRAWN AEROBIC AND ANAEROBIC 10CC EA   Final   Culture  Setup Time 12/16/2012 11:05   Final   Culture     Final   Value:        BLOOD CULTURE RECEIVED NO GROWTH TO DATE CULTURE WILL BE HELD FOR 5 DAYS BEFORE ISSUING A FINAL NEGATIVE REPORT  Report Status PENDING   Incomplete  CULTURE, BLOOD (ROUTINE X 2)     Status: None   Collection Time    12/16/12  6:47 AM      Result Value Range Status   Specimen Description BLOOD RIGHT ARM   Final   Special Requests BOTTLES DRAWN AEROBIC ONLY 10CC   Final   Culture  Setup Time 12/16/2012 11:04   Final   Culture     Final   Value:        BLOOD CULTURE RECEIVED NO GROWTH TO DATE CULTURE WILL BE HELD FOR 5 DAYS BEFORE ISSUING A FINAL NEGATIVE REPORT   Report Status PENDING   Incomplete   Studies/Results: Ct Abdomen Pelvis Wo Contrast  12/16/2012   *RADIOLOGY REPORT*  Clinical Data: Evaluate for pyelonephritis with abscess.  CT ABDOMEN AND PELVIS WITHOUT CONTRAST  Technique:  Multidetector CT imaging of the abdomen and pelvis was performed following the standard protocol without intravenous contrast.  Comparison: 12/15/2012 ultrasound.  Findings: Minimal haziness of the kidneys may represent changes pyelonephritis however, evaluation for renal abscess is  limited without contrast.  The patient was not able to receive contrast secondary to renal failure.  No CT evidence of renal collecting system obstruction.  No renal or ureteral obstructing stone detected.  Evaluation of solid abdominal viscera is limited by lack of IV contrast.  Taking this limitation into account no worrisome hepatic, splenic, pancreatic, adrenal or renal mass.  The liver is elongated spanning over 20 cm.  No calcified gallstone.  No extraluminal bowel, free fluid or free air.  Evaluation slightly limited by areas of under distension.  Peripheral calcification of the ovaries bilaterally. Etiology/significance indeterminate.  Urinary bladder.  No bony destructive lesion.  No abdominal aortic aneurysm.  Small calcification left common iliac artery consistent with advanced atherosclerotic type changes for the patient's age.  Minimal pleural effusion/pleural thickening greater on the left. Lung bases otherwise clear.  IMPRESSION: Minimal haziness of the kidneys may represent changes pyelonephritis however, evaluation for renal abscess is limited without contrast.  The patient was not able to receive contrast secondary to renal failure.  No CT evidence of renal collecting system obstruction.  No renal or ureteral obstructing stone detected.  Please see above.   Original Report Authenticated By: Lacy Duverney, M.D.   Dg Chest 2 View  12/15/2012   *RADIOLOGY REPORT*  Clinical Data: Shortness of breath and fever  CHEST - 2 VIEW  Comparison: None.  Findings: Normal cardiac silhouette and mediastinal contours.  The lungs remain hyperexpanded with flattening of the bilateral hemidiaphragms and mild diffuse thickening of the pulmonary interstitium.  No focal airspace opacities.  No pleural effusion or pneumothorax.  No definite evidence of edema.  Unchanged bones.  IMPRESSION:  Hyperexpanded lungs without acute cardiopulmonary disease.   Original Report Authenticated By: Tacey Ruiz, MD   US  Renal  12/15/2012   **ADDENDUM** CREATED: 12/15/2012 17:53:55  The following is a correction to report the findings of the left kidney.  Left kidney:  Normal cortical thickness, echogenicity and size, measuring 11.8 cm in length.  No focal renal lesions.  No echogenic renal stones.  No urinary obstruction.  **END ADDENDUM** SIGNED BY: Dyanne Carrel, MD  12/15/2012   *RADIOLOGY REPORT*  Clinical Data: Urinary tract infection, evaluate for hydronephrosis  RENAL/URINARY TRACT ULTRASOUND COMPLETE  Comparison:  None.  Findings:  Right Kidney:  Normal cortical thickness, echogenicity and size, measuring 13.1 cm in length.  No focal renal  lesions.  No echogenic renal stones.  No urinary obstruction.  Left Kidney:  Normal cortical thickness, echogenicity and size, measuring 4.0 cm in length.  No focal renal lesions.  No echogenic renal stones.  No urinary obstruction.  Bladder:  Normal given degree distension.  IMPRESSION: No evidence of urinary obstruction.   Original Report Authenticated By: Tacey Ruiz, MD   Medications: I have reviewed the patient's current medications. Scheduled Meds: . cefTRIAXone (ROCEPHIN)  IV  1 g Intravenous Q24H  . heparin  5,000 Units Subcutaneous Q8H  . potassium chloride SA  40 mEq Oral BID  . sodium chloride  3 mL Intravenous Q12H   Continuous Infusions:  PRN Meds:.acetaminophen, acetaminophen, oxyCODONE   Assessment/Plan: [name] is a 47 yo F who presented with fever for 5 days, dysuria, and back pain found to be secondary to E.coli UTI and pyelonephritis. She spiked 2 fevers of 103.1 and 101.1 over the past 24 hrs but lab work and clinical exam have improved.  UTI/Pyelonephritis: Dysuria and urinary symptoms improving. Urine culture growing pan-sensitive E.coli. CT abd/pelvis with haziness of kidneys suggesting possible pyelo. Evaluation for renal abscess is limited as the study was done without contrast. -Discontinue ceftriaxone -Start ciprofloxacin 500  bid for 7 day course -F/u blood cultures   Acute renal failure: Improving. Likely etiology is prerenal process as suggested by her FeNa and increased insensible losses from fever. Her baseline creatinine is ~0.7. On admission her creatinine was 1.65 but has trended down to 1.22 and 1.07 this morning after receiving IVF. Bilateral renal US is unremarkable for hydronephrosis or obstructive process.  -Avoid NSAIDs  -BMET tomorrow morning   Fever: Likely etiology is UTI. RMSF antibodies negative but given her exposure to ticks and since the first set of RMSF antibodies are usually negative, we will continue treatment. Lyme titers still pending. Thrombocytopenia improving and LFTs trending down. Will keep pt inpatient for 24 hrs to ensure she remains afebrile. -Doxycycline 100mg  BID  -Continue to monitor fever curve  -Tylenol 650mg  q6hr PRN  -CBC tomorrow morning  DVT prophylaxis: heparin TID.   Dispo: Disposition is deferred at this time, awaiting improvement of current medical problems. Likely discharge tomorrow AM.  The patient does not have a current PCP and does need an [facility] hospital follow-up appointment after discharge.  The patient does not have transportation limitations that hinder transportation to clinic appointments.   This is a Psychologist, occupational Note.  The care of the patient was discussed with Dr. Garald Braver, Dr. Dorise Hiss, and Dr. Dalphine Handing and the assessment and plan formulated with their assistance.  Please see their attached note for official documentation of the daily encounter.   LOS: 2 days   Kerrie Pleasure, Med Student 12/17/2012, 10:52 AM

## 2012-12-17 NOTE — Progress Notes (Signed)
Internal Medicine Teaching Service Attending Note Date: 12/17/2012  Patient name: [name]  Medical record number: [id number]  Date of birth: Oct 03, 1965   Ms Vecchio is doing much better. She has spiked once at midnight to 101F and 100F in the morning. since. She feels better, has improvement in abdominal pain, has no flank pain right now, and no dysuria or other urinary symptoms.   Filed Vitals:   12/16/12 2350 12/17/12 0118 12/17/12 0502 12/17/12 1041  BP:   108/65   Pulse:   58   Temp: 101.1 F (38.4 C) 98.2 F (36.8 C) 98 F (36.7 C) 100 F (37.8 C)  TempSrc: Rectal  Oral Rectal  Resp:   16   Height:      Weight:      SpO2:   98%     Exam: no distress Heart, lungs: No abnormality detected.  Abdomen: Soft, non-tender, no flank pain No pedal edema  . cefTRIAXone (ROCEPHIN)  IV  1 g Intravenous Q24H  . heparin  5,000 Units Subcutaneous Q8H  . potassium chloride SA  40 mEq Oral BID  . sodium chloride  3 mL Intravenous Q12H     Assessment and Plan   Continue IV antibiotics until afebrile for 24 hours. Monitor temperature curve. Patient recovering. Likely discharge tomorrow after conversion to oral antibiotics.  Rest per resident note.    Jaysa Kise 12/17/2012, 1:28 PM.

## 2012-12-17 NOTE — Progress Notes (Signed)
Subjective: She had fevers yesterday at 1500 with of 103.39F and at 2300 with 101.39F. She did not have rigors or back pain during those times. She denies dysuria today.   Objective: Vital signs in last 24 hours: Filed Vitals:   12/17/12 0118 12/17/12 0502 12/17/12 1041 12/17/12 1408  BP:  108/65  128/80  Pulse:  58  68  Temp: 98.2 F (36.8 C) 98 F (36.7 C) 100 F (37.8 C) 101.7 F (38.7 C)  TempSrc:  Oral Rectal Rectal  Resp:  16  18  Height:      Weight:      SpO2:  98%  97%   Weight change:   Intake/Output Summary (Last 24 hours) at 12/17/12 1444 Last data filed at 12/17/12 1410  Gross per 24 hour  Intake    340 ml  Output      0 ml  Net    340 ml   Vitals reviewed. General: resting in bed, in NAD HEENT:  no scleral icterus Cardiac: RRR, no rubs, murmurs or gallops Pulm: clear to auscultation bilaterally, no wheezes, rales, or rhonchi Abd: soft, nontender, nondistended, BS present. No CVA tenderness Ext: warm and well perfused, no pedal edema Neuro: alert and oriented X3, cranial nerves II-XII grossly intact, strength and sensation to light touch equal in bilateral upper and lower extremities  Lab Results: Basic Metabolic Panel:  Recent Labs Lab 12/16/12 0637 12/17/12 0624  NA 138 137  K 3.3* 3.2*  CL 104 106  CO2 22 22  GLUCOSE 107* 100*  BUN 20 15  CREATININE 1.22* 1.07  CALCIUM 8.6 8.4   Liver Function Tests:  Recent Labs Lab 12/16/12 0637 12/17/12 0624  AST 44* 36  ALT 48* 52*  ALKPHOS 185* 173*  BILITOT 0.7 0.5  PROT 6.2 5.7*  ALBUMIN 2.2* 2.0*   CBC:  Recent Labs Lab 12/16/12 0637 12/17/12 0624  WBC 11.3* 10.5  NEUTROABS  --  8.6*  HGB 12.3 11.3*  HCT 34.0* 31.7*  MCV 88.5 89.5  PLT 152 164   Urinalysis:  Recent Labs Lab 12/15/12 1334  COLORURINE AMBER*  LABSPEC 1.017  PHURINE 6.0  GLUCOSEU NEGATIVE  HGBUR SMALL*  BILIRUBINUR SMALL*  KETONESUR NEGATIVE  PROTEINUR 100*  UROBILINOGEN 2.0*  NITRITE POSITIVE*    LEUKOCYTESUR MODERATE*     Micro Results: Recent Results (from the past 240 hour(s))  URINE CULTURE     Status: None   Collection Time    12/15/12  1:34 PM      Result Value Range Status   Specimen Description URINE, CLEAN CATCH   Final   Special Requests NONE   Final   Culture  Setup Time 12/15/2012 17:47   Final   Colony Count >=100,000 COLONIES/ML   Final   Culture ESCHERICHIA COLI   Final   Report Status 12/17/2012 FINAL   Final   Organism ID, Bacteria ESCHERICHIA COLI   Final  CULTURE, BLOOD (ROUTINE X 2)     Status: None   Collection Time    12/16/12  6:37 AM      Result Value Range Status   Specimen Description BLOOD RIGHT ARM   Final   Special Requests BOTTLES DRAWN AEROBIC AND ANAEROBIC 10CC EA   Final   Culture  Setup Time 12/16/2012 11:05   Final   Culture     Final   Value:        BLOOD CULTURE RECEIVED NO GROWTH TO DATE CULTURE WILL BE HELD FOR  5 DAYS BEFORE ISSUING A FINAL NEGATIVE REPORT   Report Status PENDING   Incomplete  CULTURE, BLOOD (ROUTINE X 2)     Status: None   Collection Time    12/16/12  6:47 AM      Result Value Range Status   Specimen Description BLOOD RIGHT ARM   Final   Special Requests BOTTLES DRAWN AEROBIC ONLY 10CC   Final   Culture  Setup Time 12/16/2012 11:04   Final   Culture     Final   Value:        BLOOD CULTURE RECEIVED NO GROWTH TO DATE CULTURE WILL BE HELD FOR 5 DAYS BEFORE ISSUING A FINAL NEGATIVE REPORT   Report Status PENDING   Incomplete   Studies/Results: Ct Abdomen Pelvis Wo Contrast  12/16/2012   *RADIOLOGY REPORT*  Clinical Data: Evaluate for pyelonephritis with abscess.  CT ABDOMEN AND PELVIS WITHOUT CONTRAST  Technique:  Multidetector CT imaging of the abdomen and pelvis was performed following the standard protocol without intravenous contrast.  Comparison: 12/15/2012 ultrasound.  Findings: Minimal haziness of the kidneys may represent changes pyelonephritis however, evaluation for renal abscess is limited without  contrast.  The patient was not able to receive contrast secondary to renal failure.  No CT evidence of renal collecting system obstruction.  No renal or ureteral obstructing stone detected.  Evaluation of solid abdominal viscera is limited by lack of IV contrast.  Taking this limitation into account no worrisome hepatic, splenic, pancreatic, adrenal or renal mass.  The liver is elongated spanning over 20 cm.  No calcified gallstone.  No extraluminal bowel, free fluid or free air.  Evaluation slightly limited by areas of under distension.  Peripheral calcification of the ovaries bilaterally. Etiology/significance indeterminate.  Urinary bladder.  No bony destructive lesion.  No abdominal aortic aneurysm.  Small calcification left common iliac artery consistent with advanced atherosclerotic type changes for the patient's age.  Minimal pleural effusion/pleural thickening greater on the left. Lung bases otherwise clear.  IMPRESSION: Minimal haziness of the kidneys may represent changes pyelonephritis however, evaluation for renal abscess is limited without contrast.  The patient was not able to receive contrast secondary to renal failure.  No CT evidence of renal collecting system obstruction.  No renal or ureteral obstructing stone detected.  Please see above.   Original Report Authenticated By: Lacy Duverney, M.D.   US Renal  12/15/2012   **ADDENDUM** CREATED: 12/15/2012 17:53:55  The following is a correction to report the findings of the left kidney.  Left kidney:  Normal cortical thickness, echogenicity and size, measuring 11.8 cm in length.  No focal renal lesions.  No echogenic renal stones.  No urinary obstruction.  **END ADDENDUM** SIGNED BY: Dyanne Carrel, MD  12/15/2012   *RADIOLOGY REPORT*  Clinical Data: Urinary tract infection, evaluate for hydronephrosis  RENAL/URINARY TRACT ULTRASOUND COMPLETE  Comparison:  None.  Findings:  Right Kidney:  Normal cortical thickness, echogenicity and size,  measuring 13.1 cm in length.  No focal renal lesions.  No echogenic renal stones.  No urinary obstruction.  Left Kidney:  Normal cortical thickness, echogenicity and size, measuring 4.0 cm in length.  No focal renal lesions.  No echogenic renal stones.  No urinary obstruction.  Bladder:  Normal given degree distension.  IMPRESSION: No evidence of urinary obstruction.   Original Report Authenticated By: Tacey Ruiz, MD   Medications: I have reviewed the patient's current medications. Scheduled Meds: . cefTRIAXone (ROCEPHIN)  IV  1 g Intravenous  Q24H  . heparin  5,000 Units Subcutaneous Q8H  . potassium chloride SA  40 mEq Oral BID  . sodium chloride  3 mL Intravenous Q12H   Continuous Infusions:  PRN Meds:.acetaminophen, acetaminophen, diphenhydrAMINE, oxyCODONE Assessment/Plan: Acute renal failure: Her creatinine is improving, to 1.07 today from 1.6 on admission. FeNa of 0.1%, consistent with prerenal cause.  -Continue NS @ 147ml/hr (may d/c tomorrow) -Avoid NSAIDs   UTI: Urine culture grew E.coli, pansusceptibility (although tetracycline not tested) -Continue Rocephin, may transition to Ciprofloxacin tomorrow  Fever: Likely etiology is UTI but tick-borne illness also possible given recent exposure to two ticks, fever with high temperatures for 5 days, and mild thrombocytopaenia on admission.  -Doxycycline 100mg  BID (she will need 3 more days of tx once she is afebrile) -Continue to monitor fever curve   Tachycardia: Resolved.  Intermittent, HR in low 100s, improved with IV fluids.   Insomnia: She complains of insomnia while here in the hospital secondary to multiple noises and staff coming into her room at night. She is tired today.  -Benadryl 25mg  PO PRN  -Ambien 5mg  qHS PRN  DVT prophylaxis: heparin TID.   FEN:  NS @125ml /hr  BMET in AM  Regular diet  Dispo: Disposition is deferred at this time, awaiting improvement of current medical problems.  Anticipated discharge in  approximately tomorrow.   The patient does not have a current PCP and does not need an [facility] hospital follow-up appointment after discharge. She has follow up with the [facility] on 6/26 at 10:15.  The patient does not have transportation limitations that hinder transportation to clinic appointments.  .Services Needed at time of discharge: Y = Yes, Blank = No PT:   OT:   RN:   Equipment:   Other:     LOS: 2 days   Ky Barban, MD 12/17/2012, 2:44 PM

## 2012-12-18 LAB — BASIC METABOLIC PANEL
BUN: 12 mg/dL (ref 6–23)
Chloride: 105 mEq/L (ref 96–112)
Creatinine, Ser: 1.01 mg/dL (ref 0.50–1.10)
GFR calc Af Amer: 77 mL/min — ABNORMAL LOW (ref >90)

## 2012-12-18 LAB — CBC
HCT: 31.2 % — ABNORMAL LOW (ref 36.0–46.0)
Hemoglobin: 10.9 g/dL — ABNORMAL LOW (ref 12.0–15.0)
MCH: 31.2 pg (ref 26.0–34.0)
MCHC: 34.9 g/dL (ref 30.0–36.0)
MCV: 89.4 fL (ref 78.0–100.0)
Platelets: 221 10*3/uL (ref 150–400)
RBC: 3.49 MIL/uL — ABNORMAL LOW (ref 3.87–5.11)
RDW: 13.8 % (ref 11.5–15.5)
WBC: 9.7 10*3/uL (ref 4.0–10.5)

## 2012-12-18 MED ORDER — DOXYCYCLINE HYCLATE 100 MG PO TABS
100.0000 mg | ORAL_TABLET | Freq: Two times a day (BID) | ORAL | Status: DC
  Administered 2012-12-18 – 2012-12-19 (×3): 100 mg via ORAL
  Filled 2012-12-18 (×4): qty 1

## 2012-12-18 NOTE — Progress Notes (Signed)
Pt spiked a fever of 101.3. Pt was given 650 mg of tylenol. Temp went down only to 100. Pt would not like to use ice packs or anything at this time. rn will continue to monitor pt.

## 2012-12-18 NOTE — Progress Notes (Signed)
Subjective: [name] has no complaints this morning. Says she had 2 low grade fevers yesterday afternoon and last night that resolved with Tylenol. No rigors during the fevers. Denies back pain, abdominal pain, headache, and dysuria. She states she did not sleep well last night and did not get zolpidem. She was not pleased but agrees with the plan of not being discharged today.   Objective: Vital signs in last 24 hours: Filed Vitals:   12/17/12 2148 12/17/12 2238 12/18/12 0046 12/18/12 0458  BP: 124/71   128/73  Pulse: 66   50  Temp: 100.3 F (37.9 C) 101.3 F (38.5 C) 100 F (37.8 C) 98.7 F (37.1 C)  TempSrc: Oral Oral Oral Oral  Resp: 18   16  Height:      Weight:      SpO2: 99%   96%    Intake/Output Summary (Last 24 hours) at 12/18/12 1010 Last data filed at 12/17/12 2230  Gross per 24 hour  Intake    520 ml  Output      0 ml  Net    520 ml    General: Alert and cooperative but appears mildly anxious, NAD lying in bed  Head: Normocephalic, atraumatic  Eyes: EOMI, conjunctiva/corneas clear  Lungs: CTAB, nonlabored respirations  Heart: RRR, no murmurs appreciated  Abdomen: Normal bowel sounds, soft, nontender  Back: No CVA tenderness  Ext: No LE edema Skin: No visible rashes   Lab Results: Basic Metabolic Panel:  Recent Labs Lab 12/16/12 0637 12/17/12 0624  NA 138 137  K 3.3* 3.2*  CL 104 106  CO2 22 22  GLUCOSE 107* 100*  BUN 20 15  CREATININE 1.22* 1.07  CALCIUM 8.6 8.4   Liver Function Tests:  Recent Labs Lab 12/16/12 0637 12/17/12 0624  AST 44* 36  ALT 48* 52*  ALKPHOS 185* 173*  BILITOT 0.7 0.5  PROT 6.2 5.7*  ALBUMIN 2.2* 2.0*   CBC:  Recent Labs Lab 12/16/12 0637 12/17/12 0624  WBC 11.3* 10.5  NEUTROABS  --  8.6*  HGB 12.3 11.3*  HCT 34.0* 31.7*  MCV 88.5 89.5  PLT 152 164   Urinalysis:  Recent Labs Lab 12/15/12 1334  COLORURINE AMBER*  LABSPEC 1.017  PHURINE 6.0  GLUCOSEU NEGATIVE  HGBUR SMALL*    BILIRUBINUR SMALL*  KETONESUR NEGATIVE  PROTEINUR 100*  UROBILINOGEN 2.0*  NITRITE POSITIVE*  LEUKOCYTESUR MODERATE*   Misc. Labs: Urine Na 10  Urine Creatinine 118.08  Calculated FENa 0.1%  Blood cultures neg x2 days  Urine culture with pan sensitive E. Coli  RMSF IgG and IgM negative   Micro Results: Recent Results (from the past 240 hour(s))  URINE CULTURE     Status: None   Collection Time    12/15/12  1:34 PM      Result Value Range Status   Specimen Description URINE, CLEAN CATCH   Final   Special Requests NONE   Final   Culture  Setup Time 12/15/2012 17:47   Final   Colony Count >=100,000 COLONIES/ML   Final   Culture ESCHERICHIA COLI   Final   Report Status 12/17/2012 FINAL   Final   Organism ID, Bacteria ESCHERICHIA COLI   Final  CULTURE, BLOOD (ROUTINE X 2)     Status: None   Collection Time    12/16/12  6:37 AM      Result Value Range Status   Specimen Description BLOOD RIGHT ARM   Final   Special Requests BOTTLES DRAWN  AEROBIC AND ANAEROBIC 10CC EA   Final   Culture  Setup Time 12/16/2012 11:05   Final   Culture     Final   Value:        BLOOD CULTURE RECEIVED NO GROWTH TO DATE CULTURE WILL BE HELD FOR 5 DAYS BEFORE ISSUING A FINAL NEGATIVE REPORT   Report Status PENDING   Incomplete  CULTURE, BLOOD (ROUTINE X 2)     Status: None   Collection Time    12/16/12  6:47 AM      Result Value Range Status   Specimen Description BLOOD RIGHT ARM   Final   Special Requests BOTTLES DRAWN AEROBIC ONLY 10CC   Final   Culture  Setup Time 12/16/2012 11:04   Final   Culture     Final   Value:        BLOOD CULTURE RECEIVED NO GROWTH TO DATE CULTURE WILL BE HELD FOR 5 DAYS BEFORE ISSUING A FINAL NEGATIVE REPORT   Report Status PENDING   Incomplete   Studies/Results: Ct Abdomen Pelvis Wo Contrast  12/16/2012   *RADIOLOGY REPORT*  Clinical Data: Evaluate for pyelonephritis with abscess.  CT ABDOMEN AND PELVIS WITHOUT CONTRAST  Technique:  Multidetector CT imaging of the  abdomen and pelvis was performed following the standard protocol without intravenous contrast.  Comparison: 12/15/2012 ultrasound.  Findings: Minimal haziness of the kidneys may represent changes pyelonephritis however, evaluation for renal abscess is limited without contrast.  The patient was not able to receive contrast secondary to renal failure.  No CT evidence of renal collecting system obstruction.  No renal or ureteral obstructing stone detected.  Evaluation of solid abdominal viscera is limited by lack of IV contrast.  Taking this limitation into account no worrisome hepatic, splenic, pancreatic, adrenal or renal mass.  The liver is elongated spanning over 20 cm.  No calcified gallstone.  No extraluminal bowel, free fluid or free air.  Evaluation slightly limited by areas of under distension.  Peripheral calcification of the ovaries bilaterally. Etiology/significance indeterminate.  Urinary bladder.  No bony destructive lesion.  No abdominal aortic aneurysm.  Small calcification left common iliac artery consistent with advanced atherosclerotic type changes for the patient's age.  Minimal pleural effusion/pleural thickening greater on the left. Lung bases otherwise clear.  IMPRESSION: Minimal haziness of the kidneys may represent changes pyelonephritis however, evaluation for renal abscess is limited without contrast.  The patient was not able to receive contrast secondary to renal failure.  No CT evidence of renal collecting system obstruction.  No renal or ureteral obstructing stone detected.  Please see above.   Original Report Authenticated By: Lacy Duverney, M.D.   Medications: I have reviewed the patient's current medications. Scheduled Meds: . cefTRIAXone (ROCEPHIN)  IV  1 g Intravenous Q24H  . doxycycline  100 mg Oral Q12H  . heparin  5,000 Units Subcutaneous Q8H  . potassium chloride SA  40 mEq Oral BID  . sodium chloride  3 mL Intravenous Q12H   PRN Meds:.acetaminophen, acetaminophen,  diphenhydrAMINE, oxyCODONE, zolpidem   Assessment/Plan: [name] is a 47 yo F who presented with fever for 5 days, dysuria, and back pain found to be secondary to E.coli UTI and pyelonephritis.   UTI/Pyelonephritis: Dysuria and urinary symptoms improving however she continues to spike fevers of 101.7 and 101.3 in the past 24 hrs. Urine culture growing pan-sensitive E.coli. CT abd/pelvis with haziness of kidneys suggesting possible pyelo. We are concerned that pt is still spiking fevers and on day 3  of abx. Will monitor until she remains afebrile for 24 hrs.  -Continue ceftriaxone  -Consider CT w/ contrast today for evaluation of possible renal abscess as etiology of continued fevers -Blood cultures neg x2 days -Consider transitioning tomorrow to ciprofloxacin 500 bid for 7 day course   Acute renal failure: Improving. Likely etiology is prerenal process as suggested by her FeNa and increased insensible losses from fever. Her baseline creatinine is ~0.7. On admission her creatinine was 1.65 but has trended down to 1.22 and 1.07 yesterday after receiving IVF. Bilateral renal US is unremarkable for hydronephrosis or obstructive process.  -Avoid NSAIDs  -BMET today  Fever: Likely etiology is UTI. RMSF antibodies negative but given her exposure to ticks and since the first set of RMSF antibodies are usually negative, we will continue treatment. Lyme titers still pending. Thrombocytopenia improving and LFTs trending down.  -Doxycycline 100mg  BID  -Continue to monitor fever curve  -Tylenol 650mg  q6hr PRN  -CBC today   DVT prophylaxis: heparin TID.   Dispo: Disposition is deferred at this time, awaiting improvement of current medical problems. Likely discharge in 1-2 days. The patient does not have a current PCP and does need an [facility] hospital follow-up appointment after discharge.  The patient does not have transportation limitations that hinder transportation to clinic appointments.    This  is a Psychologist, occupational Note.  The care of the patient was discussed with Dr. Garald Braver and Dr. Dalphine Handing and the assessment and plan formulated with their assistance.  Please see their attached note for official documentation of the daily encounter.   LOS: 3 days   Kerrie Pleasure, Med Student 12/18/2012, 10:10 AM

## 2012-12-18 NOTE — Progress Notes (Signed)
Subjective: She complains of feeling bloated today with some swelling of her ankles. She has been walking in the hallways for several times. Minimum sleep last night due to her fever with Tmax of 101.82F, she denies rigors, HA, or back pain with her fever last night.   She denies headache, dysuria, chest pain, shortness of breath, calf pain, N/V, or diarrhea.   Objective: Vital signs in last 24 hours: Filed Vitals:   12/17/12 2238 12/18/12 0046 12/18/12 0458 12/18/12 1427  BP:   128/73 120/69  Pulse:   50 67  Temp: 101.3 F (38.5 C) 100 F (37.8 C) 98.7 F (37.1 C) 98.7 F (37.1 C)  TempSrc: Oral Oral Oral Oral  Resp:   16 18  Height:      Weight:      SpO2:   96% 99%   Weight change:   Intake/Output Summary (Last 24 hours) at 12/18/12 1654 Last data filed at 12/18/12 1410  Gross per 24 hour  Intake    450 ml  Output      0 ml  Net    450 ml   Vitals reviewed. General: resting in bed, in NAD, wearing her regular cothes HEENT: no scleral icterus Cardiac: RRR, no rubs, murmurs or gallops Pulm: clear to auscultation bilaterally, no wheezes, rales, or rhonchi Abd: soft, nontender, nondistended, BS present Ext: warm and well perfused, 1+ pedal edema at her ankles bilaterally, no calf tenderness bl Neuro: alert and oriented X3, cranial nerves II-XII grossly intact, strength and sensation to light touch equal in bilateral upper and lower extremities  Lab Results: Basic Metabolic Panel:  Recent Labs Lab 12/17/12 0624 12/18/12 1010  NA 137 137  K 3.2* 3.4*  CL 106 105  CO2 22 23  GLUCOSE 100* 92  BUN 15 12  CREATININE 1.07 1.01  CALCIUM 8.4 8.5   Liver Function Tests:  Recent Labs Lab 12/16/12 0637 12/17/12 0624  AST 44* 36  ALT 48* 52*  ALKPHOS 185* 173*  BILITOT 0.7 0.5  PROT 6.2 5.7*  ALBUMIN 2.2* 2.0*   CBC:  Recent Labs Lab 12/17/12 0624 12/18/12 1010  WBC 10.5 9.7  NEUTROABS 8.6*  --   HGB 11.3* 10.9*  HCT 31.7* 31.2*  MCV 89.5 89.4  PLT  164 221      Urinalysis:  Recent Labs Lab 12/15/12 1334  COLORURINE AMBER*  LABSPEC 1.017  PHURINE 6.0  GLUCOSEU NEGATIVE  HGBUR SMALL*  BILIRUBINUR SMALL*  KETONESUR NEGATIVE  PROTEINUR 100*  UROBILINOGEN 2.0*  NITRITE POSITIVE*  LEUKOCYTESUR MODERATE*    Micro Results: Recent Results (from the past 240 hour(s))  URINE CULTURE     Status: None   Collection Time    12/15/12  1:34 PM      Result Value Range Status   Specimen Description URINE, CLEAN CATCH   Final   Special Requests NONE   Final   Culture  Setup Time 12/15/2012 17:47   Final   Colony Count >=100,000 COLONIES/ML   Final   Culture ESCHERICHIA COLI   Final   Report Status 12/17/2012 FINAL   Final   Organism ID, Bacteria ESCHERICHIA COLI   Final  CULTURE, BLOOD (ROUTINE X 2)     Status: None   Collection Time    12/16/12  6:37 AM      Result Value Range Status   Specimen Description BLOOD RIGHT ARM   Final   Special Requests BOTTLES DRAWN AEROBIC AND ANAEROBIC 10CC EA  Final   Culture  Setup Time 12/16/2012 11:05   Final   Culture     Final   Value:        BLOOD CULTURE RECEIVED NO GROWTH TO DATE CULTURE WILL BE HELD FOR 5 DAYS BEFORE ISSUING A FINAL NEGATIVE REPORT   Report Status PENDING   Incomplete  CULTURE, BLOOD (ROUTINE X 2)     Status: None   Collection Time    12/16/12  6:47 AM      Result Value Range Status   Specimen Description BLOOD RIGHT ARM   Final   Special Requests BOTTLES DRAWN AEROBIC ONLY 10CC   Final   Culture  Setup Time 12/16/2012 11:04   Final   Culture     Final   Value:        BLOOD CULTURE RECEIVED NO GROWTH TO DATE CULTURE WILL BE HELD FOR 5 DAYS BEFORE ISSUING A FINAL NEGATIVE REPORT   Report Status PENDING   Incomplete   Studies/Results: No results found. Medications: I have reviewed the patient's current medications. Scheduled Meds: . cefTRIAXone (ROCEPHIN)  IV  1 g Intravenous Q24H  . doxycycline  100 mg Oral Q12H  . heparin  5,000 Units Subcutaneous Q8H  .  potassium chloride SA  40 mEq Oral BID  . sodium chloride  3 mL Intravenous Q12H   Continuous Infusions:  PRN Meds:.acetaminophen, acetaminophen, diphenhydrAMINE, oxyCODONE, zolpidem Assessment/Plan:  Acute renal failure: Her creatinine is improving, to 1.01 today from 1.6 on admission. FeNa of 0.1%, consistent with prerenal cause.  -Discontinued NS @ 166ml/hr given improvement in renal function   -Avoid NSAIDs   Acute pyelonephritis:  Urine culture grew E.coli, pansusceptibility (although tetracycline not tested)  -Continue Rocephin, may transition to Ciprofloxacin tomorrow (or once afebrile)  Fever: Likely etiology is pyeloneprhitis but tick-borne illness also possible given recent exposure to two ticks, fever with high temperatures for 5 days, and mild thrombocytopaenia on admission.  -Doxycycline 100mg  BID (she will need 3 more days of tx once she is afebrile)  -Continue to monitor fever curve   Tachycardia: Resolved. Intermittent, HR in low 100s, improved with IV fluids.   Insomnia: She complains of insomnia while here in the hospital secondary to multiple noises and staff coming into her room at night but she did not ask for Ambien or Benadryl last night. She was offered these medications to help her sleep tonight.  -Benadryl 25mg  PO PRN  -Ambien 5mg  qHS PRN   DVT prophylaxis: heparin TID.  FEN:  NSL BMET in AM  Regular diet   Dispo: Disposition is deferred at this time, awaiting improvement of current medical problems.  Anticipated discharge in approximately 1-2 day(s).   The patient does not have a current PCP  and does not need an [facility] hospital follow-up appointment after discharge.  The patient does not have transportation limitations that hinder transportation to clinic appointments.  .Services Needed at time of discharge: Y = Yes, Blank = No PT:   OT:   RN:   Equipment:   Other:     LOS: 3 days   Ky Barban, MD 12/18/2012, 4:54 PM

## 2012-12-19 DIAGNOSIS — R509 Fever, unspecified: Secondary | ICD-10-CM

## 2012-12-19 DIAGNOSIS — N10 Acute tubulo-interstitial nephritis: Principal | ICD-10-CM

## 2012-12-19 DIAGNOSIS — N179 Acute kidney failure, unspecified: Secondary | ICD-10-CM

## 2012-12-19 LAB — CBC
MCH: 31.2 pg (ref 26.0–34.0)
MCHC: 34.8 g/dL (ref 30.0–36.0)
Platelets: 269 10*3/uL (ref 150–400)
RBC: 3.33 MIL/uL — ABNORMAL LOW (ref 3.87–5.11)
RDW: 13.9 % (ref 11.5–15.5)

## 2012-12-19 LAB — BASIC METABOLIC PANEL
CO2: 23 mEq/L (ref 19–32)
Calcium: 8.6 mg/dL (ref 8.4–10.5)
GFR calc Af Amer: 75 mL/min — ABNORMAL LOW (ref >90)
GFR calc non Af Amer: 64 mL/min — ABNORMAL LOW (ref >90)
Sodium: 140 mEq/L (ref 135–145)

## 2012-12-19 MED ORDER — DOXYCYCLINE HYCLATE 100 MG PO TABS: 100.0000 mg | ORAL_TABLET | Freq: Two times a day (BID) | ORAL | Status: DC

## 2012-12-19 MED ORDER — CIPROFLOXACIN HCL 500 MG PO TABS: 500.0000 mg | ORAL_TABLET | Freq: Two times a day (BID) | ORAL | Status: DC

## 2012-12-19 NOTE — Progress Notes (Signed)
Subjective: She denies dysuria, she had no fever last night.  Objective: Vital signs in last 24 hours: Filed Vitals:   12/18/12 0458 12/18/12 1427 12/18/12 2125 12/19/12 0511  BP: 128/73 120/69 124/70 114/73  Pulse: 50 67 81 56  Temp: 98.7 F (37.1 C) 98.7 F (37.1 C) 98 F (36.7 C) 98.3 F (36.8 C)  TempSrc: Oral Oral Oral Oral  Resp: 16 18 16 16   Height:      Weight:      SpO2: 96% 99% 93% 96%   Weight change:   Intake/Output Summary (Last 24 hours) at 12/19/12 1303 Last data filed at 12/18/12 1410  Gross per 24 hour  Intake    120 ml  Output      0 ml  Net    120 ml   Vitals reviewed. General: Sitting in bed, in NAD HEENT:  no scleral icterus Cardiac: RRR, no rubs, murmurs or gallops Pulm: clear to auscultation bilaterally, no wheezes, rales, or rhonchi Abd: soft, nontender, nondistended, BS present, no CVA tenderness Ext: warm and well perfused, mild edema at her feet bilaterally Neuro: alert and oriented X3, cranial nerves II-XII grossly intact, strength and sensation to light touch equal in bilateral upper and lower extremities  Lab Results: Basic Metabolic Panel:  Recent Labs Lab 12/18/12 1010 12/19/12 0610  NA 137 140  K 3.4* 4.1  CL 105 106  CO2 23 23  GLUCOSE 92 99  BUN 12 12  CREATININE 1.01 1.04  CALCIUM 8.5 8.6   Liver Function Tests:  Recent Labs Lab 12/16/12 0637 12/17/12 0624  AST 44* 36  ALT 48* 52*  ALKPHOS 185* 173*  BILITOT 0.7 0.5  PROT 6.2 5.7*  ALBUMIN 2.2* 2.0*   CBC:  Recent Labs Lab 12/17/12 0624 12/18/12 1010 12/19/12 0610  WBC 10.5 9.7 7.2  NEUTROABS 8.6*  --   --   HGB 11.3* 10.9* 10.4*  HCT 31.7* 31.2* 29.9*  MCV 89.5 89.4 89.8  PLT 164 221 269   Urinalysis:  Recent Labs Lab 12/15/12 1334  COLORURINE AMBER*  LABSPEC 1.017  PHURINE 6.0  GLUCOSEU NEGATIVE  HGBUR SMALL*  BILIRUBINUR SMALL*  KETONESUR NEGATIVE  PROTEINUR 100*  UROBILINOGEN 2.0*  NITRITE POSITIVE*  LEUKOCYTESUR MODERATE*    Micro Results: Recent Results (from the past 240 hour(s))  URINE CULTURE     Status: None   Collection Time    12/15/12  1:34 PM      Result Value Range Status   Specimen Description URINE, CLEAN CATCH   Final   Special Requests NONE   Final   Culture  Setup Time 12/15/2012 17:47   Final   Colony Count >=100,000 COLONIES/ML   Final   Culture ESCHERICHIA COLI   Final   Report Status 12/17/2012 FINAL   Final   Organism ID, Bacteria ESCHERICHIA COLI   Final  CULTURE, BLOOD (ROUTINE X 2)     Status: None   Collection Time    12/16/12  6:37 AM      Result Value Range Status   Specimen Description BLOOD RIGHT ARM   Final   Special Requests BOTTLES DRAWN AEROBIC AND ANAEROBIC 10CC EA   Final   Culture  Setup Time 12/16/2012 11:05   Final   Culture     Final   Value:        BLOOD CULTURE RECEIVED NO GROWTH TO DATE CULTURE WILL BE HELD FOR 5 DAYS BEFORE ISSUING A FINAL NEGATIVE REPORT   Report  Status PENDING   Incomplete  CULTURE, BLOOD (ROUTINE X 2)     Status: None   Collection Time    12/16/12  6:47 AM      Result Value Range Status   Specimen Description BLOOD RIGHT ARM   Final   Special Requests BOTTLES DRAWN AEROBIC ONLY 10CC   Final   Culture  Setup Time 12/16/2012 11:04   Final   Culture     Final   Value:        BLOOD CULTURE RECEIVED NO GROWTH TO DATE CULTURE WILL BE HELD FOR 5 DAYS BEFORE ISSUING A FINAL NEGATIVE REPORT   Report Status PENDING   Incomplete   Studies/Results: No results found. Medications: I have reviewed the patient's current medications. Scheduled Meds: . cefTRIAXone (ROCEPHIN)  IV  1 g Intravenous Q24H  . doxycycline  100 mg Oral Q12H  . potassium chloride SA  40 mEq Oral BID  . sodium chloride  3 mL Intravenous Q12H   Continuous Infusions:  PRN Meds:.acetaminophen, acetaminophen, diphenhydrAMINE, oxyCODONE, zolpidem Assessment/Plan:  Acute renal failure: Her creatinine is improving, to 1.04 today from 1.6 on admission. -Discontinued IV fluids   -Avoid NSAIDs   UTI: Urine culture grew E.coli, pansusceptibility (although tetracycline not tested)  -She received 4 doses of Rocephin IV, to be discharged with cipro  Fever: Resolved. Likely etiology is UTI but tick-borne illness also possible given recent exposure to two ticks, fever with high temperatures for 5 days, and mild thrombocytopaenia on admission.  -Doxycycline 100mg  BID (she will need 2 days of tx)    Tachycardia: Resolved. Intermittent, HR in low 100s, improved with IV fluids.   DVT prophylaxis: heparin TID.  FEN:  NSL BMET in AM  Regular diet   Dispo: Disposition is deferred at this time, awaiting improvement of current medical problems.  Anticipated discharge in approximately today.   The patient does not have a current PCP and does not need an [facility] hospital follow-up appointment after discharge.  The patient does not have transportation limitations that hinder transportation to clinic appointments.  .Services Needed at time of discharge: Y = Yes, Blank = No PT:   OT:   RN:   Equipment:   Other:     LOS: 4 days   Ky Barban, MD 12/19/2012, 1:03 PM

## 2012-12-19 NOTE — Progress Notes (Signed)
NURSING PROGRESS NOTE  [name] [id number] Discharge Data: 12/19/2012 1:30 PM Attending Provider: [name], MD PCP:No PCP Per Patient     Lady Saucier to be D/C'd Home per MD order.    All IV's discontinued with no bleeding noted.  All belongings returned to patient for patient to take home.   Last Vital Signs:  Blood pressure 114/73, pulse 56, temperature 98.3 F (36.8 C), temperature source Oral, resp. rate 16, height 5\' 8"  (1.727 m), weight 60.3 kg (132 lb 15 oz), last menstrual period 11/30/2012, SpO2 96.00%.  Discharge Medication List   Medication List    STOP taking these medications       ibuprofen 800 MG tablet  Commonly known as:  ADVIL,MOTRIN      TAKE these medications       ciprofloxacin 500 MG tablet  Commonly known as:  CIPRO  Take 1 tablet (500 mg total) by mouth 2 (two) times daily.     doxycycline 100 MG tablet  Commonly known as:  VIBRA-TABS  Take 1 tablet (100 mg total) by mouth every 12 (twelve) hours.     oxyCODONE-acetaminophen 5-325 MG per tablet  Commonly known as:  PERCOCET  Take 1 tablet by mouth every 4 (four) hours as needed for pain.        Madelin Rear, MSN, RN, [employer]

## 2012-12-19 NOTE — Discharge Summary (Signed)
Name: [name] MRN: [id number] DOB: 20-Mar-1966 47 y.o. PCP: No Pcp Per Patient  Date of Admission: 12/15/2012  1:22 PM Date of Discharge: 12/19/2012 Attending Physician: Dr. [name]  Discharge Diagnosis: Principal Problem:   Acute pyelonephritis Active Problems:   ARF (acute renal failure)   UTI (lower urinary tract infection)   Fever  Discharge Medications:   Medication List    STOP taking these medications       ibuprofen 800 MG tablet  Commonly known as:  ADVIL,MOTRIN      TAKE these medications       ciprofloxacin 500 MG tablet  Commonly known as:  CIPRO  Take 1 tablet (500 mg total) by mouth 2 (two) times daily.     doxycycline 100 MG tablet  Commonly known as:  VIBRA-TABS  Take 1 tablet (100 mg total) by mouth every 12 (twelve) hours.     oxyCODONE-acetaminophen 5-325 MG per tablet  Commonly known as:  PERCOCET  Take 1 tablet by mouth every 4 (four) hours as needed for pain.        Disposition and follow-up:   [name] was discharged from [facility] in Good condition.  At the hospital follow up visit please address:  1.  Assure resolution of her dysuria and fever and assess compliance to her antibiotic treatment.   2.  Labs / imaging needed at time of follow-up: BMET (for creatinine trending), consider repeating RMSF serology  3.  Pending labs/ test needing follow-up: None  Follow-up Appointments:     Follow-up Information   Follow up with [facility] On 12/25/2012. (10:15, please bring $20 co pay and photo id and paperwork with meidications)    Contact information:   [address] [place] [address] [phone]      Discharge Instructions: Discharge Orders   Future Appointments Provider Department Dept Phone   12/25/2012 10:15 AM [facility] Covering Provider [facility] Joan Flores [phone]   Future Orders Complete By Expires     Diet - low sodium heart healthy  As directed     Increase activity slowly  As directed        Consultations:  None  Procedures Performed:  Ct Abdomen Pelvis Wo Contrast  12/16/2012   *RADIOLOGY REPORT*  Clinical Data: Evaluate for pyelonephritis with abscess.  CT ABDOMEN AND PELVIS WITHOUT CONTRAST  Technique:  Multidetector CT imaging of the abdomen and pelvis was performed following the standard protocol without intravenous contrast.  Comparison: 12/15/2012 ultrasound.  Findings: Minimal haziness of the kidneys may represent changes pyelonephritis however, evaluation for renal abscess is limited without contrast.  The patient was not able to receive contrast secondary to renal failure.  No CT evidence of renal collecting system obstruction.  No renal or ureteral obstructing stone detected.  Evaluation of solid abdominal viscera is limited by lack of IV contrast.  Taking this limitation into account no worrisome hepatic, splenic, pancreatic, adrenal or renal mass.  The liver is elongated spanning over 20 cm.  No calcified gallstone.  No extraluminal bowel, free fluid or free air.  Evaluation slightly limited by areas of under distension.  Peripheral calcification of the ovaries bilaterally. Etiology/significance indeterminate.  Urinary bladder.  No bony destructive lesion.  No abdominal aortic aneurysm.  Small calcification left common iliac artery consistent with advanced atherosclerotic type changes for the patient's age.  Minimal pleural effusion/pleural thickening greater on the left. Lung bases otherwise clear.  IMPRESSION: Minimal haziness of the kidneys may represent changes pyelonephritis however, evaluation for renal abscess is limited without contrast.  The patient was not able to receive contrast secondary to renal failure.  No CT evidence of renal collecting system obstruction.  No renal or ureteral obstructing stone detected.  Please see above.   Original Report Authenticated By: Lacy Duverney,  M.D.   Dg Chest 2 View  12/15/2012   *RADIOLOGY REPORT*  Clinical Data: Shortness of breath and fever  CHEST - 2 VIEW  Comparison: None.  Findings: Normal cardiac silhouette and mediastinal contours.  The lungs remain hyperexpanded with flattening of the bilateral hemidiaphragms and mild diffuse thickening of the pulmonary interstitium.  No focal airspace opacities.  No pleural effusion or pneumothorax.  No definite evidence of edema.  Unchanged bones.  IMPRESSION:  Hyperexpanded lungs without acute cardiopulmonary disease.   Original Report Authenticated By: Tacey Ruiz, MD   US Renal  12/15/2012   **ADDENDUM** CREATED: 12/15/2012 17:53:55  The following is a correction to report the findings of the left kidney.  Left kidney:  Normal cortical thickness, echogenicity and size, measuring 11.8 cm in length.  No focal renal lesions.  No echogenic renal stones.  No urinary obstruction.  **END ADDENDUM** SIGNED BY: Dyanne Carrel, MD  12/15/2012   *RADIOLOGY REPORT*  Clinical Data: Urinary tract infection, evaluate for hydronephrosis  RENAL/URINARY TRACT ULTRASOUND COMPLETE  Comparison:  None.  Findings:  Right Kidney:  Normal cortical thickness, echogenicity and size, measuring 13.1 cm in length.  No focal renal lesions.  No echogenic renal stones.  No urinary obstruction.  Left Kidney:  Normal cortical thickness, echogenicity and size, measuring 4.0 cm in length.  No focal renal lesions.  No echogenic renal stones.  No urinary obstruction.  Bladder:  Normal given degree distension.  IMPRESSION: No evidence of urinary obstruction.   Original Report Authenticated By: Tacey Ruiz, MD   Dg Hand Complete Right  11/23/2012   *RADIOLOGY REPORT*  Clinical Data: Animal bite to the right hand, with laceration at the fifth finger and metacarpal.  RIGHT HAND - COMPLETE 3+ VIEW  Comparison: None.  Findings: There is no definite evidence of osseous disruption. Significant soft tissue disruption is noted along the  fifth digit, difficult to fully characterize due to the overlying bandage. Visualized joint spaces are grossly preserved.  No radiopaque foreign bodies are seen.  The carpal rows appear grossly intact, and demonstrate normal alignment.  IMPRESSION: No definite evidence of osseous disruption; no radiopaque foreign bodies seen.   Original Report Authenticated By: Tonia Ghent, M.D.    Admission HPI:  Ms. Behnke is a 47 year old woman with no PMH who presents with fever for 5 days as well as dysuria and back pain. She explains that the back pain is only present when he has rigors from her fever. The fever was as high as 103.83F and subsided with either Ibuprofen or Tylenol. She has taken Ibuprofen 200mg  every 4-6 hours for the past 5 days and Tylenol extra-strength once per day for the past 5 days. She had dog bite to her right hand about 3 weeks ago and was treated with clindamycin x 7days which she finished over one week ago, 3-4 days prior to the start of her fever. The wound was sutured, her Tetanus vaccine is up to date and the dog had been vaccinated for Rabies. Her right hand wound is healing well with no drainage, swelling, or erythema. She did find  two ticks on her body today but she states that they had not "bitten" her.  She denies headache, photophobia, blurry vision, neck stiffness, chest pain, shortness of breath, abdominal pain, diarrhea, constipation, hematuria, or N/V.   Hospital Course by problem list:  Acute pyelonephritis and UTI - She presented with fever, dysuria, and pyuria and was promptly started on Ceftriaxone IV. Interestingly, she did not have CVA tenderness on presentation but developed it on the second day of her hospitalization. A CT scan abdomen/pelvis with no contrast was remarkable for haziness of her kidneys suspicious for pyelonephritis. She continued to improve clinically with her fever curve gradually trending down. She had been afebrile for over 24 hours prior to her  discharge. Her urine culture grew Escherichia Coli susceptible to all antibiotics tested, including Ceftriaxone and ciprofloxacin. She was discharged with prescription of ciprofloxacin 500mg  BID for 10 days for a total of a 14 day antibiotic treatment. She will follow up with the [facility] for assurance of resolution of her symptoms.    Acute renal failure: Her creatinine on admission was 1.65, her baseline creatinine appears to be in the 0.7-0.8 range. This problem was thought to be most likely secondary to prerenal azotemia due to recent intake per mouth, with increased sensible losses due to her fever, and Ibuprofen use. Her FeNa was less than 1% and her AKI quickly improved with IV hydration further supporting prerenal azotemia as its main. Her renal US was unremarkable for hydronephrosis and her urine output was adequate ruling out post renal causes of her AKI Her creatinine steadily improved and was close to her baseline at 1.01 just prior to her discharge. She was advised to continue drinking plenty of fluids and to avoid Ibuprofen and other NSAIDs while she still recovers from her AKI. She will follow up with the [facility].    Fever: Likely secondary to her pyelonephritis and UTI but tick-borne illness was also considered as a possible cause given recent exposure to two ticks. Sshe had removed one small tick from her leg just prior to her presentation, and found another one on her body a few days earlier, she did not know if the ticks had been attached to her body prior to initiation of her fever. [place] Spotted Fever can manifest without its typical rash can cause her high grade fever that may start after 2-3 days of Rickettsia rickettsia transmission from an infected tick. Of course her fever could have been caused by acute pyelonephritis alone but given the unclear duration history of tick bite and initiation of her fever Doxycycline 100mg  BID was started for  empiric treatment of RMSF. Her serology for RMSF was negative but this does not exclude this diagnosis as most cases of RMSF have negative results for serologies in the first week. Repeat serology may be indicated for definite diagnoses of RMSF but will not change her medical management. She will be discharged with prescription for doxycycline for 2 more days, for a total of a 3-day course after she has been afebrile.    Tachycardia. Intermittent, HR in low 100s on presentation, most likely secondary to dehydration as it improved with IV fluids and had resolved on the second day o her admission.   Discharge Vitals:   BP 114/73  Pulse 56  Temp(Src) 98.3 F (36.8 C) (Oral)  Resp 16  Ht 5\' 8"  (1.727 m)  Wt 132 lb 15 oz (60.3 kg)  BMI 20.22 kg/m2  SpO2 96%  LMP  11/30/2012  Discharge Labs:  Results for orders placed during the hospital encounter of 12/15/12 (from the past 24 hour(s))  CBC     Status: Abnormal   Collection Time    12/19/12  6:10 AM      Result Value Range   WBC 7.2  4.0 - 10.5 K/uL   RBC 3.33 (*) 3.87 - 5.11 MIL/uL   Hemoglobin 10.4 (*) 12.0 - 15.0 g/dL   HCT 16.1 (*) 09.6 - 04.5 %   MCV 89.8  78.0 - 100.0 fL   MCH 31.2  26.0 - 34.0 pg   MCHC 34.8  30.0 - 36.0 g/dL   RDW 40.9  81.1 - 91.4 %   Platelets 269  150 - 400 K/uL  BASIC METABOLIC PANEL     Status: Abnormal   Collection Time    12/19/12  6:10 AM      Result Value Range   Sodium 140  135 - 145 mEq/L   Potassium 4.1  3.5 - 5.1 mEq/L   Chloride 106  96 - 112 mEq/L   CO2 23  19 - 32 mEq/L   Glucose, Bld 99  70 - 99 mg/dL   BUN 12  6 - 23 mg/dL   Creatinine, Ser 7.82  0.50 - 1.10 mg/dL   Calcium 8.6  8.4 - 95.6 mg/dL   GFR calc non Af Amer 64 (*) >90 mL/min   GFR calc Af Amer 75 (*) >90 mL/min    Signed: Ky Barban, MD 12/19/2012, 8:49 PM   Time Spent on Discharge: 40 minutes Services Ordered on Discharge: None Equipment Ordered on Discharge: None

## 2012-12-19 NOTE — Progress Notes (Signed)
Subjective: [name]. [name] feels ready to go home. No fevers, chills, abdominal pain, back pain, or other complaints.  Objective: Vital signs in last 24 hours: Filed Vitals:   12/18/12 0458 12/18/12 1427 12/18/12 2125 12/19/12 0511  BP: 128/73 120/69 124/70 114/73  Pulse: 50 67 81 56  Temp: 98.7 F (37.1 C) 98.7 F (37.1 C) 98 F (36.7 C) 98.3 F (36.8 C)  TempSrc: Oral Oral Oral Oral  Resp: 16 18 16 16   Height:      Weight:      SpO2: 96% 99% 93% 96%    Intake/Output Summary (Last 24 hours) at 12/19/12 1144 Last data filed at 12/18/12 1410  Gross per 24 hour  Intake    270 ml  Output      0 ml  Net    270 ml    General: Alert and cooperative, NAD sitting in chair Head: Normocephalic, atraumatic  Eyes: EOMI, conjunctiva/corneas clear  Lungs: CTAB, nonlabored respirations  Heart: RRR, no murmurs appreciated  Abdomen: Normal bowel sounds, soft, nontender  Back: No CVA tenderness  Ext: No LE edema  Skin: No visible rashes   Lab Results: Basic Metabolic Panel:  Recent Labs Lab 12/18/12 1010 12/19/12 0610  NA 137 140  K 3.4* 4.1  CL 105 106  CO2 23 23  GLUCOSE 92 99  BUN 12 12  CREATININE 1.01 1.04  CALCIUM 8.5 8.6   Liver Function Tests:  Recent Labs Lab 12/16/12 0637 12/17/12 0624  AST 44* 36  ALT 48* 52*  ALKPHOS 185* 173*  BILITOT 0.7 0.5  PROT 6.2 5.7*  ALBUMIN 2.2* 2.0*   CBC:  Recent Labs Lab 12/17/12 0624 12/18/12 1010 12/19/12 0610  WBC 10.5 9.7 7.2  NEUTROABS 8.6*  --   --   HGB 11.3* 10.9* 10.4*  HCT 31.7* 31.2* 29.9*  MCV 89.5 89.4 89.8  PLT 164 221 269   Urinalysis:  Recent Labs Lab 12/15/12 1334  COLORURINE AMBER*  LABSPEC 1.017  PHURINE 6.0  GLUCOSEU NEGATIVE  HGBUR SMALL*  BILIRUBINUR SMALL*  KETONESUR NEGATIVE  PROTEINUR 100*  UROBILINOGEN 2.0*  NITRITE POSITIVE*  LEUKOCYTESUR MODERATE*   Misc. Labs: Urine Na 10  Urine Creatinine 118.08  Calculated FENa 0.1%  Blood cultures neg x2 days  Urine  culture with pan sensitive E. Coli  RMSF IgG and IgM negative   Micro Results: Recent Results (from the past 240 hour(s))  URINE CULTURE     Status: None   Collection Time    12/15/12  1:34 PM      Result Value Range Status   Specimen Description URINE, CLEAN CATCH   Final   Special Requests NONE   Final   Culture  Setup Time 12/15/2012 17:47   Final   Colony Count >=100,000 COLONIES/ML   Final   Culture ESCHERICHIA COLI   Final   Report Status 12/17/2012 FINAL   Final   Organism ID, Bacteria ESCHERICHIA COLI   Final  CULTURE, BLOOD (ROUTINE X 2)     Status: None   Collection Time    12/16/12  6:37 AM      Result Value Range Status   Specimen Description BLOOD RIGHT ARM   Final   Special Requests BOTTLES DRAWN AEROBIC AND ANAEROBIC 10CC EA   Final   Culture  Setup Time 12/16/2012 11:05   Final   Culture     Final   Value:        BLOOD CULTURE RECEIVED NO GROWTH  TO DATE CULTURE WILL BE HELD FOR 5 DAYS BEFORE ISSUING A FINAL NEGATIVE REPORT   Report Status PENDING   Incomplete  CULTURE, BLOOD (ROUTINE X 2)     Status: None   Collection Time    12/16/12  6:47 AM      Result Value Range Status   Specimen Description BLOOD RIGHT ARM   Final   Special Requests BOTTLES DRAWN AEROBIC ONLY 10CC   Final   Culture  Setup Time 12/16/2012 11:04   Final   Culture     Final   Value:        BLOOD CULTURE RECEIVED NO GROWTH TO DATE CULTURE WILL BE HELD FOR 5 DAYS BEFORE ISSUING A FINAL NEGATIVE REPORT   Report Status PENDING   Incomplete   Medications: I have reviewed the patient's current medications. Scheduled Meds: . cefTRIAXone (ROCEPHIN)  IV  1 g Intravenous Q24H  . doxycycline  100 mg Oral Q12H  . potassium chloride SA  40 mEq Oral BID  . sodium chloride  3 mL Intravenous Q12H   PRN Meds:.acetaminophen, acetaminophen, diphenhydrAMINE, oxyCODONE, zolpidem   Assessment/Plan: [name] is a 47 yo F who presented with fever for 5 days, dysuria, and back pain found to be  secondary to E.coli UTI and pyelonephritis.   UTI/Pyelonephritis: Pt afebrile over past 24 hrs. Dysuria and urinary symptoms improved with no CVA tenderness on exam. Urine culture growing pan-sensitive E.coli.  -Discontinue ceftriaxone and transition to ciprofloxacin 500 bid for 14 day course. -Blood cultures neg x2 days   Acute renal failure: Improving. Likely etiology is prerenal process as suggested by her FeNa and increased insensible losses from fever. Her baseline creatinine is ~0.7. On admission her creatinine was 1.65 but has trended down to 1.04 yesterday (after IVF x2 days). Bilateral renal US is unremarkable for hydronephrosis or obstructive process.  -Avoid NSAIDs   Fever: Likely etiology is UTI. RMSF antibodies negative but given her exposure to ticks and since the first set of RMSF antibodies are usually negative regardless of infection, we will continue treatment. Lyme titers still pending. Thrombocytopenia improving and LFTs trending down.  -Doxycycline 100mg  BID for 3 more days   -Tylenol 650mg  q6hr PRN   DVT prophylaxis: heparin TID.   Dispo: Likely discharge today. The patient does not have a current PCP and does need an [facility] hospital follow-up appointment after discharge.  The patient does not have transportation limitations that hinder transportation to clinic appointments.    This is a Psychologist, occupational Note.  The care of the patient was discussed with Dr. Garald Braver, Dr. Dorise Hiss, and Dr. Dalphine Handing and the assessment and plan formulated with their assistance.  Please see their attached note for official documentation of the daily encounter.   LOS: 4 days   [name], Med Student 12/19/2012, 11:44 AM

## 2012-12-21 DIAGNOSIS — R000 Tachycardia, unspecified: Secondary | ICD-10-CM | POA: Diagnosis present

## 2012-12-22 LAB — CULTURE, BLOOD (ROUTINE X 2): Culture: NO GROWTH

## 2012-12-22 NOTE — Discharge Summary (Signed)
Internal Medicine Teaching Service Attending Note Date: 12/22/2012  Patient name: [name]  Medical record number: [id number]  Date of birth: December 27, 1965    I evaluated the patient on the day of discharge and discussed the discharge plan with my resident team. I agree with the discharge documentation and disposition by Dr. Garald Braver.     Thanks [name] 12/22/2012, 6:52 AM

## 2012-12-25 ENCOUNTER — Other Ambulatory Visit (HOSPITAL_COMMUNITY)
Admission: RE | Admit: 2012-12-25 | Discharge: 2012-12-25 | Disposition: A | Discharge status: Home / Self Care | Payer: No Typology Code available for payment source | Source: Ambulatory Visit | Attending: Family Medicine | Admitting: Family Medicine

## 2012-12-25 ENCOUNTER — Ambulatory Visit: Payer: Medicaid Other | Attending: Family Medicine | Admitting: Internal Medicine

## 2012-12-25 VITALS — BP 127/82 | HR 97 | Temp 98.8°F | Resp 16 | Ht 68.0 in | Wt 126.8 lb

## 2012-12-25 DIAGNOSIS — Z0000 Encounter for general adult medical examination without abnormal findings: Secondary | ICD-10-CM

## 2012-12-25 DIAGNOSIS — N12 Tubulo-interstitial nephritis, not specified as acute or chronic: Secondary | ICD-10-CM

## 2012-12-25 DIAGNOSIS — N179 Acute kidney failure, unspecified: Secondary | ICD-10-CM

## 2012-12-25 DIAGNOSIS — Z01419 Encounter for gynecological examination (general) (routine) without abnormal findings: Secondary | ICD-10-CM | POA: Insufficient documentation

## 2012-12-25 DIAGNOSIS — N898 Other specified noninflammatory disorders of vagina: Secondary | ICD-10-CM | POA: Insufficient documentation

## 2012-12-25 DIAGNOSIS — N920 Excessive and frequent menstruation with regular cycle: Secondary | ICD-10-CM | POA: Insufficient documentation

## 2012-12-25 DIAGNOSIS — Z1151 Encounter for screening for human papillomavirus (HPV): Secondary | ICD-10-CM | POA: Insufficient documentation

## 2012-12-25 LAB — POCT URINALYSIS DIPSTICK
Leukocytes, UA: NEGATIVE
Nitrite, UA: NEGATIVE
Protein, UA: NEGATIVE
Urobilinogen, UA: 0.2
pH, UA: 7

## 2012-12-25 LAB — FOLLICLE STIMULATING HORMONE: FSH: 8.3 m[IU]/mL

## 2012-12-25 LAB — BASIC METABOLIC PANEL
Calcium: 9.4 mg/dL (ref 8.4–10.5)
Creat: 1.1 mg/dL (ref 0.50–1.10)
Sodium: 137 mEq/L (ref 135–145)

## 2012-12-25 NOTE — Progress Notes (Signed)
PT HERE FOR F/U VISIT S/P KIDNEY/UTI INFECTION D/C'D LAST Friday FROM [place].DENIES PAIN AND HEMATURIA. WAS TOLD TO COME HERE FOR REPEAT URINE AND SIGN UP FOR ORANGE CARD.VSS

## 2012-12-25 NOTE — Patient Instructions (Addendum)

## 2012-12-25 NOTE — Progress Notes (Addendum)
Patient ID: [name], female   DOB: August 09, 1965, 47 y.o.   MRN: [id number]   CC:  Brown vaginal discharge.  HPI:  [name] is a 47 year old woman who was hospitalized from 12/15/2012-12/19/2012 for acute pyelonephritis and acute renal failure. She was discharged home on a course of Cipro and doxycycline. She also had a recent bite injury for which she completed a course of clindamycin. Urine cultures grew Escherichia coli. Her most recent creatinine was 1.04 on 12/19/2012. She also was treated with doxycycline for a suspected tickborne illness secondary to tick exposure although her RMSF titers were negative. The patient states that she has not had any further fevers. She has about 5 days of the Cipro left and has completed the doxycycline. She has not had any nausea, vomiting or dysuria. She has not had any rashes. She still has occasional flank pain. She requests a Pap smear today. Patient notes that she has had a long-standing discharge of brown colored material. Denies dyspareunia.  No Known Allergies History reviewed. No pertinent past medical history. Current Outpatient Prescriptions on File Prior to Visit  Medication Sig Dispense Refill  . ciprofloxacin (CIPRO) 500 MG tablet Take 1 tablet (500 mg total) by mouth 2 (two) times daily.  20 tablet  0  . doxycycline (VIBRA-TABS) 100 MG tablet Take 1 tablet (100 mg total) by mouth every 12 (twelve) hours.  4 tablet  0  . oxyCODONE-acetaminophen (PERCOCET) 5-325 MG per tablet Take 1 tablet by mouth every 4 (four) hours as needed for pain.  18 tablet  0   No current facility-administered medications on file prior to visit.   History reviewed. No pertinent family history. History   Social History  . Marital Status: Single    Spouse Name: N/A    Number of Children: N/A  . Years of Education: N/A   Occupational History  . Not on file.   Social History Main Topics  . Smoking status: Current Every Day Smoker -- 1.00 packs/day     Types: Cigarettes  . Smokeless tobacco: Not on file  . Alcohol Use: Yes     Comment: occasional  . Drug Use: Yes    Special: Marijuana  . Sexually Active: Not on file   Other Topics Concern  . Not on file   Social History Narrative  . No narrative on file    Review of Systems: Constitutional: No fever or chills;  Appetite normal; No weight loss.  HEENT: No blurry vision or diplopia, no pharyngitis or dysphagia CV: No chest pain or arrhythmia.  Resp: No SOB, no cough. GI: No N/V, no diarrhea, no melena or hematochezia.  GU: No dysuria or hematuria.  MSK: no myalgias/arthralgias.  Neuro:  No headache or focal neurological deficits.  Psych: No depression or anxiety.  Endo: No thyroid disease or DM.  Skin: No rashes or lesions.  Heme: No anemia or blood dyscrasia   Objective:   Filed Vitals:   12/25/12 1038  BP: 127/82  Pulse: 97  Temp: 98.8 F (37.1 C)  Resp: 16    Physical Exam  Constitutional: Appears well-developed and well-nourished. No distress.  HENT: Normocephalic. External right and left ear normal. Oropharynx is clear and moist.  Eyes: Conjunctivae and EOM are normal. PERRLA, no scleral icterus.  Neck: Normal ROM. Neck supple. No JVD. No tracheal deviation. No thyromegaly.  CVS: RRR, S1/S2 +, no murmurs, no gallops, no carotid bruit.  Pulmonary: Effort and breath sounds normal, no stridor, rhonchi, wheezes, rales.  Abdominal: Soft. BS +,  no distension, tenderness, rebound or guarding. Musculoskeletal: Normal range of motion. No edema and no tenderness.  Neuro: Alert. Normal reflexes, muscle tone coordination. No cranial nerve deficit. Skin: Skin is warm and dry. No rash noted. Not diaphoretic. No erythema. No pallor.  Psychiatric: Normal mood and affect. Behavior, judgment, thought content normal.   Lab Results  Component Value Date   WBC 7.2 12/19/2012   HGB 10.4* 12/19/2012   HCT 29.9* 12/19/2012   MCV 89.8 12/19/2012   PLT 269 12/19/2012   Lab Results   Component Value Date   CREATININE 1.04 12/19/2012   BUN 12 12/19/2012   NA 140 12/19/2012   K 4.1 12/19/2012   CL 106 12/19/2012   CO2 23 12/19/2012    No results found for this basename: HGBA1C   Lipid Panel     Component Value Date/Time   CHOL 137 10/10/2012 0511   TRIG 56 10/10/2012 0511   HDL 57 10/10/2012 0511   CHOLHDL 2.4 10/10/2012 0511   VLDL 11 10/10/2012 0511   LDLCALC 69 10/10/2012 0511       Assessment and plan:   Patient Active Problem List   Diagnosis Date Noted  . Tachycardia 12/21/2012  . Acute pyelonephritis 12/17/2012  . ARF (acute renal failure) 12/15/2012  . UTI (lower urinary tract infection) 12/15/2012  . Fever 12/15/2012   1. Vaginal discharge/menorrhagia: Patient was given a Pap smear. FSH was checked to see if she could be perimenopausal. If her [facility] levels are not consistent with a perimenopausal state, may need to consider OB-GYN consultation for assessment and evaluation of abnormal discharge. 2. History of acute renal failure: Patient's creatinine was normalizing at discharge. Repeat BMET today. 3. History pyelonephritis: Patient's urine dipstick was negative for nitrites and leukocytes. She appears to be recovering nicely and has proximally 5 more days of antibiotics to complete.  Return to the clinic: One week for test results.  Signed:  Dr. Trula Ore Nastacia Raybuck 12/25/2012 11:23 AM

## 2012-12-31 NOTE — Progress Notes (Signed)
Quick Note:  FSH WNL (not peri-menopausal). Needs OB-GYN evaluation for possible endometrial biopsy/ultrasound to assess endometrial thickness. Urinalysis is clear. Kidney function stable.  Nehemyah Foushee 12/31/2012 3:02 PM  ______

## 2013-01-08 ENCOUNTER — Other Ambulatory Visit: Payer: Self-pay | Admitting: Family Medicine

## 2013-01-08 ENCOUNTER — Telehealth: Payer: Self-pay

## 2013-01-08 DIAGNOSIS — R9389 Abnormal findings on diagnostic imaging of other specified body structures: Secondary | ICD-10-CM

## 2013-01-08 NOTE — Telephone Encounter (Signed)
Message copied by Lestine Mount on Thu Jan 08, 2013 12:31 PM ------      Message from: RAMA, Trula Ore P      Created: Wed Dec 31, 2012  3:02 PM       FSH WNL (not peri-menopausal).  Needs OB-GYN evaluation for possible endometrial biopsy/ultrasound to assess endometrial thickness.      Urinalysis is clear.  Kidney function stable.            RAMA,CHRISTINA      12/31/2012      3:02 PM       ------

## 2013-01-08 NOTE — Telephone Encounter (Signed)
Patient aware of lab results Will get referred to ob-gyn

## 2013-01-21 ENCOUNTER — Telehealth: Payer: Self-pay | Admitting: Family Medicine

## 2013-01-21 NOTE — Telephone Encounter (Signed)
Pt has a question for nurse; did not specify

## 2013-01-22 ENCOUNTER — Telehealth: Payer: Self-pay | Admitting: *Deleted

## 2013-01-22 NOTE — Telephone Encounter (Signed)
01/22/13 Spoke with patient stated she was having some back pain. Appointment made  For 01/23/13 to see doctor. P.Bryna Razavi,RN BSN [facility]

## 2013-01-23 ENCOUNTER — Ambulatory Visit: Payer: No Typology Code available for payment source | Attending: Family Medicine | Admitting: Internal Medicine

## 2013-01-23 VITALS — BP 145/89 | HR 73 | Temp 98.4°F | Resp 16 | Ht 67.0 in | Wt 125.6 lb

## 2013-01-23 DIAGNOSIS — R358 Other polyuria: Secondary | ICD-10-CM | POA: Insufficient documentation

## 2013-01-23 DIAGNOSIS — R3589 Other polyuria: Secondary | ICD-10-CM | POA: Insufficient documentation

## 2013-01-23 DIAGNOSIS — M549 Dorsalgia, unspecified: Secondary | ICD-10-CM | POA: Insufficient documentation

## 2013-01-23 DIAGNOSIS — M6530 Trigger finger, unspecified finger: Secondary | ICD-10-CM

## 2013-01-23 DIAGNOSIS — G8929 Other chronic pain: Secondary | ICD-10-CM

## 2013-01-23 DIAGNOSIS — N390 Urinary tract infection, site not specified: Secondary | ICD-10-CM

## 2013-01-23 LAB — POCT URINALYSIS DIPSTICK
Glucose, UA: NEGATIVE
Nitrite, UA: NEGATIVE
Urobilinogen, UA: 0.2

## 2013-01-23 LAB — HEMOGLOBIN A1C: Hgb A1c MFr Bld: 5 % (ref <5.7)

## 2013-01-23 LAB — BASIC METABOLIC PANEL
BUN: 13 mg/dL (ref 6–23)
Calcium: 10 mg/dL (ref 8.4–10.5)
Glucose, Bld: 94 mg/dL (ref 70–99)
Potassium: 4.4 mEq/L (ref 3.5–5.3)
Sodium: 139 mEq/L (ref 135–145)

## 2013-01-23 MED ORDER — MELOXICAM 7.5 MG PO TABS: 7.5000 mg | ORAL_TABLET | Freq: Every day | ORAL | Status: DC

## 2013-01-23 NOTE — Progress Notes (Signed)
PT HERE WITH CONTI NOUS LOWER BACK PAIN AND FREQ URINATION X 2 WEEKS POST KIDNEY/UTI INFECTION. COMPLETED ATB COURSE BUT STATES THE PAIN IS WORSENING IN BACK. VSS. DENIES FEVER,CHILLS. NO VAGINAL D/C NOTED.

## 2013-01-23 NOTE — Progress Notes (Signed)
Patient ID: [name], female   DOB: 1966-04-19, 47 y.o.   MRN: [id number] Patient Demographics  [name], is a 47 y.o. female  KGM:[id number]  [id number]  DOB - 05-03-1966  Chief Complaint  Patient presents with  . Urinary Tract Infection    FREQUENCY BUT NO BURNING OR ITCHING POST KIDNEY INFECTION        Subjective:   [name] today is here for a follow up visit. The patient had a recent hospitalization due to pyelonephritis and Escherichia coli UTI in 6/14. She's been having bilateral hip pain/lower back for last few weeks, worse on sitting,3/10, doesn't bother her much on ambulating. Patient also has a trigger finger after a dog bite, on right hand, 5th finger. She's also complaining of polyuria and increased frequency of urination, UA done today does not show any UTI. She denies any polyphagia or polydipsia. Denies any dysuria or hematuria. Denies any family history of diabetes except in one aunt.   Patient has No headache, No chest pain, No abdominal pain - No Nausea, No new weakness tingling or numbness, No Cough - SOB.   Objective:    Filed Vitals:   01/23/13 1545  BP: 145/89  Pulse: 73  Temp: 98.4 F (36.9 C)  TempSrc: Oral  Resp: 16  Height: 5\' 7"  (1.702 m)  Weight: 125 lb 9.6 oz (56.972 kg)  SpO2: 99%     ALLERGIES:  No Known Allergies  PAST MEDICAL HISTORY: History reviewed. No pertinent past medical history.  MEDICATIONS AT HOME: Prior to Admission medications   Medication Sig Start Date End Date Taking? Authorizing Provider  meloxicam (MOBIC) 7.5 MG tablet Take 1 tablet (7.5 mg total) by mouth daily. 01/23/13   Ripudeep Jenna Luo, MD     Exam  General appearance :Awake, alert, NAD, Speech Clear.  HEENT: Atraumatic and Normocephalic, PERLA Neck: supple, no JVD. No cervical lymphadenopathy.  Chest: Clear to auscultation bilaterally, no wheezing, rales or rhonchi CVS: S1 S2 regular, no murmurs.  Abdomen: soft, NBS, NT,  ND, no gaurding, rigidity or rebound. Extremities: no cyanosis or clubbing, B/L Lower Ext shows no edema Neurology: Awake alert, and oriented X 3, CN II-XII intact, Non focal Skin: No Rash or lesions Wounds:N/A Back: No spinal tenderness    Data Review   Basic Metabolic Panel: No results found for this basename: NA, K, CL, CO2, GLUCOSE, BUN, CREATININE, CALCIUM, MG, PHOS,  in the last 168 hours Liver Function Tests: No results found for this basename: AST, ALT, ALKPHOS, BILITOT, PROT, ALBUMIN,  in the last 168 hours  CBC: No results found for this basename: WBC, NEUTROABS, HGB, HCT, MCV, PLT,  in the last 168 hours  ------------------------------------------------------------------------------------------------------------------ No results found for this basename: HGBA1C,  in the last 72 hours ------------------------------------------------------------------------------------------------------------------ No results found for this basename: CHOL, HDL, LDLCALC, TRIG, CHOLHDL, LDLDIRECT,  in the last 72 hours ------------------------------------------------------------------------------------------------------------------ No results found for this basename: TSH, T4TOTAL, FREET3, T3FREE, THYROIDAB,  in the last 72 hours ------------------------------------------------------------------------------------------------------------------ No results found for this basename: VITAMINB12, FOLATE, FERRITIN, TIBC, IRON, RETICCTPCT,  in the last 72 hours  Coagulation profile  No results found for this basename: INR, PROTIME,  in the last 168 hours    Assessment & Plan   Active Problems: 1. Back pain/ hip pain   - Does not have UTI, will get hip x-rays, denies any trauma - Start on Mobic 7.5 mg daily  2. Polyuria: No other symptoms of polyphagia or polydipsia, any dysuria or hematuria -  UA doesn't show any UTI.  - Obtain BMET, urine osmolarity, serum osmolality to rule out DI, hemoglobin  A1c   3. trigger finger after a dog bite  - Will send ambulatory referral to hand surgery    Recommendations: Followup on the labs, x-rays Follow-up in 2 to weeks      RAI,RIPUDEEP M.D. 01/23/2013, 4:54 PM

## 2013-01-24 LAB — OSMOLALITY: Osmolality: 288 mOsm/kg (ref 275–300)

## 2013-01-24 LAB — OSMOLALITY, URINE: Osmolality, Ur: 737 mOsm/kg (ref 390–1090)

## 2013-01-27 ENCOUNTER — Encounter: Payer: Self-pay | Admitting: Obstetrics & Gynecology

## 2013-02-05 ENCOUNTER — Telehealth: Payer: Self-pay | Admitting: Family Medicine

## 2013-02-05 NOTE — Telephone Encounter (Signed)
Pt would like blood work results, especially for glucose levels.  Pt says that she had Mobic script filled but has not been taking meds because she feels better and wanted to prevent side affects listed for med.  Pt canceled f/u appt for 8/8 but says she will reschedule if glucose levels high.  Please f/u with pt.

## 2013-02-05 NOTE — Telephone Encounter (Signed)
Patient made aware of her blood results

## 2013-02-06 ENCOUNTER — Ambulatory Visit: Payer: No Typology Code available for payment source

## 2013-03-11 ENCOUNTER — Ambulatory Visit (INDEPENDENT_AMBULATORY_CARE_PROVIDER_SITE_OTHER): Payer: No Typology Code available for payment source | Admitting: Obstetrics & Gynecology

## 2013-03-11 ENCOUNTER — Encounter: Payer: Self-pay | Admitting: Obstetrics & Gynecology

## 2013-03-11 ENCOUNTER — Other Ambulatory Visit (HOSPITAL_COMMUNITY)
Admission: RE | Admit: 2013-03-11 | Discharge: 2013-03-11 | Disposition: A | Discharge status: Home / Self Care | Payer: No Typology Code available for payment source | Source: Ambulatory Visit | Attending: Obstetrics & Gynecology | Admitting: Obstetrics & Gynecology

## 2013-03-11 VITALS — BP 147/83 | HR 89 | Temp 97.6°F | Ht 67.0 in | Wt 128.4 lb

## 2013-03-11 DIAGNOSIS — Z01812 Encounter for preprocedural laboratory examination: Secondary | ICD-10-CM

## 2013-03-11 DIAGNOSIS — N949 Unspecified condition associated with female genital organs and menstrual cycle: Secondary | ICD-10-CM

## 2013-03-11 DIAGNOSIS — N938 Other specified abnormal uterine and vaginal bleeding: Secondary | ICD-10-CM

## 2013-03-11 DIAGNOSIS — R87619 Unspecified abnormal cytological findings in specimens from cervix uteri: Secondary | ICD-10-CM | POA: Insufficient documentation

## 2013-03-11 NOTE — Progress Notes (Signed)
  Subjective:    Patient ID: [name], female    DOB: Jul 02, 1966, 47 y.o.   MRN: [id number]  HPI  47 yo MW P3 (19 and two 15 yos) here referred from the [facility] clinic because of abnormal periods. She reports that she almost always has to wear a pad because of irregular bleeding. This bleeding pattern has been present since the birth of her twins 15 years ago. Her TSH was 4.49 in 4/14. Pap normal 6/14. HBG 10.4 6/14/.  Review of Systems  PPS for contraception No mammogram ever    Objective:   Physical Exam  NSSA, NT, mobile, normal adnexal exam   UPT negative, consent signed, time out done Cervix prepped with betadine and grasped with a single tooth tenaculum Uterus sounded to 8 cm Pipelle used for 2 passes with a moderate amount of tissue obtained. She tolerated the procedure well.       Assessment & Plan:  DUB-order gyn u/s and recheck TSH

## 2013-03-11 NOTE — Progress Notes (Deleted)
  Subjective:    Patient ID: [name], female    DOB: 02-01-1966, 47 y.o.   MRN: [id number]  HPI    Review of Systems     Objective:   Physical Exam        Assessment & Plan:

## 2013-03-11 NOTE — Patient Instructions (Signed)

## 2013-03-12 LAB — TSH: TSH: 1.422 u[IU]/mL (ref 0.350–4.500)

## 2013-03-13 ENCOUNTER — Ambulatory Visit (HOSPITAL_COMMUNITY)
Admission: RE | Admit: 2013-03-13 | Discharge: 2013-03-13 | Disposition: A | Discharge status: Home / Self Care | Payer: No Typology Code available for payment source | Source: Ambulatory Visit | Attending: Obstetrics & Gynecology | Admitting: Obstetrics & Gynecology

## 2013-03-13 DIAGNOSIS — N949 Unspecified condition associated with female genital organs and menstrual cycle: Secondary | ICD-10-CM | POA: Insufficient documentation

## 2013-03-13 DIAGNOSIS — N938 Other specified abnormal uterine and vaginal bleeding: Secondary | ICD-10-CM | POA: Insufficient documentation

## 2013-04-06 ENCOUNTER — Ambulatory Visit (INDEPENDENT_AMBULATORY_CARE_PROVIDER_SITE_OTHER): Payer: No Typology Code available for payment source | Admitting: Obstetrics & Gynecology

## 2013-04-06 ENCOUNTER — Encounter: Payer: Self-pay | Admitting: Obstetrics & Gynecology

## 2013-04-06 VITALS — BP 155/87 | HR 69 | Temp 98.7°F | Ht 67.0 in | Wt 131.3 lb

## 2013-04-06 DIAGNOSIS — N938 Other specified abnormal uterine and vaginal bleeding: Secondary | ICD-10-CM

## 2013-04-06 DIAGNOSIS — N949 Unspecified condition associated with female genital organs and menstrual cycle: Secondary | ICD-10-CM

## 2013-04-06 DIAGNOSIS — D649 Anemia, unspecified: Secondary | ICD-10-CM

## 2013-04-06 NOTE — Progress Notes (Signed)
  Subjective:    Patient ID: [name], female    DOB: [birth year], 47 y.o.   MRN: [id number]  HPI  47 yo Kelly with anemia and DUB, normal u/s, TSH, and embx.  Review of Systems     Objective:   Physical Exam        Assessment & Plan:  DUB and anemia- I have offered her Mirena and endometrial ablation. She opts for ablation. She has had a BTL in the past.

## 2013-04-06 NOTE — Patient Instructions (Addendum)
Endometrial Ablation Endometrial ablation removes the lining of the uterus (endometrium). It is usually a same day, outpatient treatment. Ablation helps avoid major surgery (such as a hysterectomy). A hysterectomy is removal of the cervix and uterus. Endometrial ablation has less risk and complications, has a shorter recovery period and is less expensive. After endometrial ablation, most women will have little or no menstrual bleeding. You may not keep your fertility. Pregnancy is no longer likely after this procedure but if you are pre-menopausal, you still need to use a reliable method of birth control following the procedure because pregnancy can occur. REASONS TO HAVE THE PROCEDURE MAY INCLUDE:  Heavy periods.  Bleeding that is causing anemia.  Anovulatory bleeding, very irregular, bleeding.  Bleeding submucous fibroids (on the lining inside the uterus) if they are smaller than 3 centimeters. REASONS NOT TO HAVE THE PROCEDURE MAY INCLUDE:  You wish to have more children.  You have a pre-cancerous or cancerous problem. The cause of any abnormal bleeding must be diagnosed before having the procedure.  You have pain coming from the uterus.  You have a submucus fibroid larger than 3 centimeters.  You recently had a baby.  You recently had an infection in the uterus.  You have a severe retro-flexed, tipped uterus and cannot insert the instrument to do the ablation.  You had a Cesarean section or deep major surgery on the uterus.  The inner cavity of the uterus is too large for the endometrial ablation instrument. RISKS AND COMPLICATIONS   Perforation of the uterus.  Bleeding.  Infection of the uterus, bladder or vagina.  Injury to surrounding organs.  Cutting the cervix.  An air bubble to the lung (air embolus).  Pregnancy following the procedure.  Failure of the procedure to help the problem requiring hysterectomy.  Decreased ability to diagnose cancer in the lining of  the uterus. BEFORE THE PROCEDURE  The lining of the uterus must be tested to make sure there is no pre-cancerous or cancer cells present.  Medications may be given to make the lining of the uterus thinner.  Ultrasound may be used to evaluate the size and look for abnormalities of the uterus.  Future pregnancy is not desired. PROCEDURE  There are different ways to destroy the lining of the uterus.   Resectoscope - radio frequency-alternating electric current is the most common one used.  Cryotherapy - freezing the lining of the uterus.  Heated Free Liquid - heated salt (saline) solution inserted into the uterus.  Microwave - uses high energy microwaves in the uterus.  Thermal Balloon - a catheter with a balloon tip is inserted into the uterus and filled with heated fluid. Your caregiver will talk with you about the method used in this clinic. They will also instruct you on the pros and cons of the procedure. Endometrial ablation is performed along with a procedure called operative hysteroscopy. A narrow viewing tube is inserted through the birth canal (vagina) and through the cervix into the uterus. A tiny camera attached to the viewing tube (hysteroscope) allows the uterine cavity to be shown on a TV monitor during surgery. Your uterus is filled with a harmless liquid to make the procedure easier. The lining of the uterus is then removed. The lining can also be removed with a resectoscope which allows your surgeon to cut away the lining of the uterus under direct vision. Usually, you will be able to go home within an hour after the procedure. HOME CARE INSTRUCTIONS   Do   not drive for 24 hours.  No tampons, douching or intercourse for 2 weeks or until your caregiver approves.  Rest at home for 24 to 48 hours. You may then resume normal activities unless told differently by your caregiver.  Take your temperature two times a day for 4 days, and record it.  Take any medications your  caregiver has ordered, as directed.  Use some form of contraception if you are pre-menopausal and do not want to get pregnant. Bleeding after the procedure is normal. It varies from light spotting and mildly watery to bloody discharge for 4 to 6 weeks. You may also have mild cramping. Only take over-the-counter or prescription medicines for pain, discomfort, or fever as directed by your caregiver. Do not use aspirin, as this may aggravate bleeding. Frequent urination during the first 24 hours is normal. You will not know how effective your surgery is until at least 3 months after the surgery. SEEK IMMEDIATE MEDICAL CARE IF:   Bleeding is heavier than a normal menstrual cycle.  An oral temperature above 102 F (38.9 C) develops.  You have increasing cramps or pains not relieved with medication or develop belly (abdominal) pain which does not seem to be related to the same area of earlier cramping and pain.  You are light headed, weak or have fainting episodes.  You develop pain in the shoulder strap areas.  You have chest or leg pain.  You have abnormal vaginal discharge.  You have painful urination. Document Released: 04/27/2004 Document Revised: 09/10/2011 Document Reviewed: 07/26/2007 [employer] Patient Information 2014 [employer].  

## 2013-04-15 ENCOUNTER — Encounter (HOSPITAL_COMMUNITY): Payer: Self-pay | Admitting: Pharmacist

## 2013-04-20 ENCOUNTER — Ambulatory Visit: Payer: No Typology Code available for payment source | Admitting: Occupational Therapy

## 2013-04-22 ENCOUNTER — Telehealth: Payer: Self-pay | Admitting: Internal Medicine

## 2013-04-22 ENCOUNTER — Ambulatory Visit: Payer: No Typology Code available for payment source | Attending: Surgery | Admitting: Occupational Therapy

## 2013-04-22 DIAGNOSIS — M25649 Stiffness of unspecified hand, not elsewhere classified: Secondary | ICD-10-CM | POA: Insufficient documentation

## 2013-04-22 DIAGNOSIS — K029 Dental caries, unspecified: Secondary | ICD-10-CM

## 2013-04-22 DIAGNOSIS — Z4789 Encounter for other orthopedic aftercare: Secondary | ICD-10-CM | POA: Insufficient documentation

## 2013-04-22 NOTE — Telephone Encounter (Signed)
Patient would like a referral to the dentist. Please advise.

## 2013-04-22 NOTE — Telephone Encounter (Signed)
Referral placed in Epic.

## 2013-04-22 NOTE — Telephone Encounter (Signed)
I called [name] and give resources to dental clinic cause our [facility] is only accepting emergency referrals at the moment .

## 2013-04-27 ENCOUNTER — Encounter (HOSPITAL_COMMUNITY): Payer: Self-pay

## 2013-04-30 ENCOUNTER — Ambulatory Visit: Payer: No Typology Code available for payment source | Admitting: Occupational Therapy

## 2013-05-07 ENCOUNTER — Ambulatory Visit: Payer: No Typology Code available for payment source | Attending: Surgery | Admitting: Occupational Therapy

## 2013-05-07 DIAGNOSIS — Z4789 Encounter for other orthopedic aftercare: Secondary | ICD-10-CM | POA: Insufficient documentation

## 2013-05-07 DIAGNOSIS — M25649 Stiffness of unspecified hand, not elsewhere classified: Secondary | ICD-10-CM | POA: Insufficient documentation

## 2013-05-14 ENCOUNTER — Ambulatory Visit: Payer: No Typology Code available for payment source | Admitting: Occupational Therapy

## 2013-05-20 ENCOUNTER — Ambulatory Visit: Payer: No Typology Code available for payment source | Admitting: Obstetrics & Gynecology

## 2013-05-21 ENCOUNTER — Ambulatory Visit: Payer: No Typology Code available for payment source | Admitting: Occupational Therapy

## 2013-05-26 ENCOUNTER — Ambulatory Visit: Payer: No Typology Code available for payment source | Admitting: Occupational Therapy

## 2013-06-01 ENCOUNTER — Ambulatory Visit (HOSPITAL_COMMUNITY): Payer: MEDICAID | Admitting: Anesthesiology

## 2013-06-01 ENCOUNTER — Encounter (HOSPITAL_COMMUNITY)
Admission: RE | Disposition: A | Discharge status: Home / Self Care | Payer: Self-pay | Source: Ambulatory Visit | Attending: Obstetrics & Gynecology

## 2013-06-01 ENCOUNTER — Ambulatory Visit: Payer: No Typology Code available for payment source | Admitting: Occupational Therapy

## 2013-06-01 ENCOUNTER — Encounter (HOSPITAL_COMMUNITY): Payer: Self-pay | Admitting: Anesthesiology

## 2013-06-01 ENCOUNTER — Other Ambulatory Visit: Payer: Self-pay | Admitting: Obstetrics & Gynecology

## 2013-06-01 ENCOUNTER — Encounter (HOSPITAL_COMMUNITY): Payer: MEDICAID | Admitting: Anesthesiology

## 2013-06-01 ENCOUNTER — Ambulatory Visit (HOSPITAL_COMMUNITY)
Admission: RE | Admit: 2013-06-01 | Discharge: 2013-06-01 | Disposition: A | Discharge status: Home / Self Care | Payer: MEDICAID | Source: Ambulatory Visit | Attending: Obstetrics & Gynecology | Admitting: Obstetrics & Gynecology

## 2013-06-01 ENCOUNTER — Encounter: Payer: Self-pay | Admitting: *Deleted

## 2013-06-01 DIAGNOSIS — N949 Unspecified condition associated with female genital organs and menstrual cycle: Secondary | ICD-10-CM | POA: Insufficient documentation

## 2013-06-01 DIAGNOSIS — N938 Other specified abnormal uterine and vaginal bleeding: Secondary | ICD-10-CM | POA: Insufficient documentation

## 2013-06-01 DIAGNOSIS — F17200 Nicotine dependence, unspecified, uncomplicated: Secondary | ICD-10-CM | POA: Insufficient documentation

## 2013-06-01 DIAGNOSIS — Z1231 Encounter for screening mammogram for malignant neoplasm of breast: Secondary | ICD-10-CM

## 2013-06-01 DIAGNOSIS — D649 Anemia, unspecified: Secondary | ICD-10-CM | POA: Insufficient documentation

## 2013-06-01 HISTORY — PX: NOVASURE ABLATION: SHX5394

## 2013-06-01 LAB — CBC
HCT: 38.3 % (ref 36.0–46.0)
MCV: 92.5 fL (ref 78.0–100.0)
RBC: 4.14 MIL/uL (ref 3.87–5.11)
WBC: 8.8 10*3/uL (ref 4.0–10.5)

## 2013-06-01 LAB — PREGNANCY, URINE: Preg Test, Ur: NEGATIVE

## 2013-06-01 SURGERY — NOVASURE ABLATION
Anesthesia: General | Site: Uterus | Wound class: Clean Contaminated

## 2013-06-01 MED ORDER — MEPERIDINE HCL 25 MG/ML IJ SOLN: 6.2500 mg | INTRAMUSCULAR | Status: DC | PRN

## 2013-06-01 MED ORDER — BUPIVACAINE HCL (PF) 0.5 % IJ SOLN
INTRAMUSCULAR | Status: DC | PRN
  Administered 2013-06-01: 10 mL

## 2013-06-01 MED ORDER — MIDAZOLAM HCL 2 MG/2ML IJ SOLN
INTRAMUSCULAR | Status: AC
  Filled 2013-06-01: qty 2

## 2013-06-01 MED ORDER — PROPOFOL 10 MG/ML IV BOLUS
INTRAVENOUS | Status: DC | PRN
  Administered 2013-06-01: 150 mg via INTRAVENOUS

## 2013-06-01 MED ORDER — FENTANYL CITRATE 0.05 MG/ML IJ SOLN
INTRAMUSCULAR | Status: DC | PRN
  Administered 2013-06-01: 100 ug via INTRAVENOUS

## 2013-06-01 MED ORDER — ONDANSETRON HCL 4 MG/2ML IJ SOLN: 4.0000 mg | Freq: Once | INTRAMUSCULAR | Status: DC | PRN

## 2013-06-01 MED ORDER — KETOROLAC TROMETHAMINE 30 MG/ML IJ SOLN
15.0000 mg | Freq: Once | INTRAMUSCULAR | Status: AC | PRN
  Administered 2013-06-01: 30 mg via INTRAVENOUS

## 2013-06-01 MED ORDER — IBUPROFEN 800 MG PO TABS: 800.0000 mg | ORAL_TABLET | Freq: Three times a day (TID) | ORAL | Status: DC | PRN

## 2013-06-01 MED ORDER — FENTANYL CITRATE 0.05 MG/ML IJ SOLN
INTRAMUSCULAR | Status: AC
  Filled 2013-06-01: qty 2

## 2013-06-01 MED ORDER — LIDOCAINE HCL (CARDIAC) 20 MG/ML IV SOLN
INTRAVENOUS | Status: AC
  Filled 2013-06-01: qty 5

## 2013-06-01 MED ORDER — ONDANSETRON HCL 4 MG/2ML IJ SOLN
INTRAMUSCULAR | Status: AC
  Filled 2013-06-01: qty 2

## 2013-06-01 MED ORDER — DEXAMETHASONE SODIUM PHOSPHATE 10 MG/ML IJ SOLN
INTRAMUSCULAR | Status: AC
  Filled 2013-06-01: qty 1

## 2013-06-01 MED ORDER — OXYCODONE-ACETAMINOPHEN 5-325 MG PO TABS: 1.0000 | ORAL_TABLET | ORAL | Status: DC | PRN

## 2013-06-01 MED ORDER — ONDANSETRON HCL 4 MG/2ML IJ SOLN
INTRAMUSCULAR | Status: DC | PRN
  Administered 2013-06-01: 4 mg via INTRAVENOUS

## 2013-06-01 MED ORDER — BUPIVACAINE HCL (PF) 0.5 % IJ SOLN
INTRAMUSCULAR | Status: AC
  Filled 2013-06-01: qty 30

## 2013-06-01 MED ORDER — FENTANYL CITRATE 0.05 MG/ML IJ SOLN
25.0000 ug | INTRAMUSCULAR | Status: DC | PRN
  Administered 2013-06-01: 50 ug via INTRAVENOUS

## 2013-06-01 MED ORDER — MIDAZOLAM HCL 5 MG/5ML IJ SOLN
INTRAMUSCULAR | Status: DC | PRN
  Administered 2013-06-01: 2 mg via INTRAVENOUS

## 2013-06-01 MED ORDER — DEXAMETHASONE SODIUM PHOSPHATE 4 MG/ML IJ SOLN
INTRAMUSCULAR | Status: DC | PRN
  Administered 2013-06-01: 10 mg via INTRAVENOUS

## 2013-06-01 MED ORDER — LACTATED RINGERS IV SOLN
INTRAVENOUS | Status: DC
  Administered 2013-06-01: 07:00:00 via INTRAVENOUS

## 2013-06-01 MED ORDER — PROPOFOL 10 MG/ML IV EMUL
INTRAVENOUS | Status: AC
  Filled 2013-06-01: qty 20

## 2013-06-01 MED ORDER — FENTANYL CITRATE 0.05 MG/ML IJ SOLN
INTRAMUSCULAR | Status: AC
  Administered 2013-06-01: 50 ug via INTRAVENOUS
  Filled 2013-06-01: qty 2

## 2013-06-01 MED ORDER — LIDOCAINE HCL (CARDIAC) 20 MG/ML IV SOLN
INTRAVENOUS | Status: DC | PRN
  Administered 2013-06-01: 80 mg via INTRAVENOUS
  Administered 2013-06-01: 20 mg via INTRAVENOUS

## 2013-06-01 MED ORDER — BUPIVACAINE HCL (PF) 0.25 % IJ SOLN
INTRAMUSCULAR | Status: AC
  Filled 2013-06-01: qty 30

## 2013-06-01 MED ORDER — KETOROLAC TROMETHAMINE 30 MG/ML IJ SOLN
INTRAMUSCULAR | Status: AC
  Filled 2013-06-01: qty 1

## 2013-06-01 SURGICAL SUPPLY — 10 items
ABLATOR ENDOMETRIAL BIPOLAR (ABLATOR) ×2 IMPLANT
CATH ROBINSON RED A/P 16FR (CATHETERS) ×2 IMPLANT
CLOTH BEACON ORANGE TIMEOUT ST (SAFETY) ×2 IMPLANT
GLOVE BIO SURGEON STRL SZ 6.5 (GLOVE) ×4 IMPLANT
GOWN PREVENTION PLUS LG XLONG (DISPOSABLE) ×4 IMPLANT
NEEDLE SPNL 22GX3.5 QUINCKE BK (NEEDLE) ×2 IMPLANT
PACK VAGINAL MINOR WOMEN LF (CUSTOM PROCEDURE TRAY) ×2 IMPLANT
SYR CONTROL 10ML LL (SYRINGE) ×2 IMPLANT
TOWEL OR 17X24 6PK STRL BLUE (TOWEL DISPOSABLE) ×4 IMPLANT
WATER STERILE IRR 1000ML POUR (IV SOLUTION) ×2 IMPLANT

## 2013-06-01 NOTE — Op Note (Signed)
06/01/2013  8:02 AM  PATIENT:  [name]  47 y.o. female  PRE-OPERATIVE DIAGNOSIS:  Dysfunctional Uterine Bleeding and Anemia  POST-OPERATIVE DIAGNOSIS:  Dysfunctional Uterine Bleeding and Anemia  PROCEDURE:  Procedure(s): NOVASURE ABLATION (N/A)  SURGEON:  Surgeon(s) and Role:    * Allie Bossier, MD - Primary  PHYSICIAN ASSISTANT:   ASSISTANTS: none   ANESTHESIA:   general  EBL:  Total I/O In: -  Out: 50 [Urine:50]  BLOOD ADMINISTERED:none  DRAINS: none   LOCAL MEDICATIONS USED:  MARCAINE     SPECIMEN:  No Specimen  DISPOSITION OF SPECIMEN:  N/A  COUNTS:  YES  TOURNIQUET:  * No tourniquets in log *  DICTATION: .Dragon Dictation  PLAN OF CARE: Discharge to home after PACU  PATIENT DISPOSITION:  PACU - hemodynamically stable.   Delay start of Pharmacological VTE agent (>24hrs) due to surgical blood loss or risk of bleeding: not applicable    The risks, benefits, and alternatives of surgery were explained, understood, and accepted. I quoted her a 90% satisfaction rate for the NovaSure endometrial ablation. All questions were answered. She was taken to the operating room and placed in the dorsal lithotomy position. General anesthesia was applied without complication. Her vagina was prepped and draped in the usual sterile fashion. Her bladder was emptied with a Robinson catheter.  A bimanual exam revealed a normal size and shaped mobile, anteverted uterus.Her adnexa were non-enlarged. A  speculum was placed  and the anterior lip of her cervix was grasped with a single-tooth tenaculum. A paracervical block was performed using 10 mL of 0.5% Marcaine. Her uterus sounded to 10 cm. Her cervical length measured 5 cm. This gives her uterine cavity length of 5 cm. The cervix was gently dilated with Hegar dilators to accommodate the NovaSure device. The arms of the device was deployed and the uterine cavity width measured 4.3 cm. The device passed its test. An it ran for 90  seconds. I removed the tenaculum and no bleeding was noted. The NovaSure device was removed and no bleeding was noted from the endocervix. She was extubated and taken to the recovery room in stable condition. She tolerated the procedure well.

## 2013-06-01 NOTE — Anesthesia Preprocedure Evaluation (Signed)
Anesthesia Evaluation  Patient identified by MRN, date of birth, ID band Patient awake    Reviewed: Allergy & Precautions, H&P , NPO status , Patient's Chart, lab work & pertinent test results  Airway Mallampati: I TM Distance: >3 FB Neck ROM: full    Dental no notable dental hx. (+) Teeth Intact   Pulmonary neg pulmonary ROS, Current Smoker,    Pulmonary exam normal       Cardiovascular negative cardio ROS      Neuro/Psych negative neurological ROS  negative psych ROS   GI/Hepatic negative GI ROS, Neg liver ROS,   Endo/Other  negative endocrine ROS  Renal/GU   negative genitourinary   Musculoskeletal negative musculoskeletal ROS (+)   Abdominal Normal abdominal exam  (+)   Peds  Hematology negative hematology ROS (+)   Anesthesia Other Findings   Reproductive/Obstetrics negative OB ROS                           Anesthesia Physical Anesthesia Plan  ASA: II  Anesthesia Plan: General   Post-op Pain Management:    Induction: Intravenous  Airway Management Planned: LMA  Additional Equipment:   Intra-op Plan:   Post-operative Plan:   Informed Consent: I have reviewed the patients History and Physical, chart, labs and discussed the procedure including the risks, benefits and alternatives for the proposed anesthesia with the patient or authorized representative who has indicated his/her understanding and acceptance.     Plan Discussed with: CRNA and Surgeon  Anesthesia Plan Comments:         Anesthesia Quick Evaluation

## 2013-06-01 NOTE — Transfer of Care (Signed)
Immediate Anesthesia Transfer of Care Note  Patient: [name]  Procedure(s) Performed: Procedure(s): NOVASURE ABLATION (N/A)  Patient Location: PACU  Anesthesia Type:General  Level of Consciousness: awake, alert  and oriented  Airway & Oxygen Therapy: Patient Spontanous Breathing and Patient connected to nasal cannula oxygen  Post-op Assessment: Report given to PACU RN and Post -op Vital signs reviewed and stable  Post vital signs: Reviewed and stable  Complications: No apparent anesthesia complications

## 2013-06-01 NOTE — Anesthesia Postprocedure Evaluation (Signed)
Anesthesia Post Note  Patient: [name]  Procedure(s) Performed: Procedure(s) (LRB): NOVASURE ABLATION (N/A)  Anesthesia type: General  Patient location: PACU  Post pain: Pain level controlled  Post assessment: Post-op Vital signs reviewed  Last Vitals:  Filed Vitals:   06/01/13 0845  BP:   Pulse: 68  Temp:   Resp: 20    Post vital signs: Reviewed  Level of consciousness: sedated  Complications: No apparent anesthesia complications

## 2013-06-01 NOTE — H&P (Signed)
[name] is a 47 yo lady with anemia and DUB, normal u/s, TSH, and embx. She declines Mirena and opts for endometrial ablation. She is aware of the 90% satisfaction rate and 10% dissatisfaction rate.   Pertinent Gynecological History: Menses: flow is excessive with use of 7 pads or tampons on heaviest days Bleeding: dysfunctional uterine bleeding Contraception: none DES exposure: denies Blood transfusions: none Sexually transmitted diseases: no past history Previous GYN Procedures: d&c for a miscarriage as a teenager   Last pap: normal Date: 2014 OB History: G3, P2A1   Menstrual History: Menarche age: 61 Patient's last menstrual period was 04/21/2013.    History reviewed. No pertinent past medical history.  Past Surgical History  Procedure Laterality Date  . Cesarian    . Cesarean section      History reviewed. No pertinent family history.  Social History:  reports that she has been smoking Cigarettes.  She has been smoking about 1.00 pack per day. She does not have any smokeless tobacco history on file. She reports that she drinks alcohol. She reports that she uses illicit drugs (Marijuana).  Allergies: No Known Allergies  Prescriptions prior to admission  Medication Sig Dispense Refill  . ibuprofen (ADVIL,MOTRIN) 200 MG tablet Take 400 mg by mouth daily as needed (back pain).        ROS S/p BTL Married for 27 years  Blood pressure 131/77, pulse 80, temperature 98.2 F (36.8 C), temperature source Oral, resp. rate 18, height 5\' 7"  (1.702 m), weight 59.421 kg (131 lb), last menstrual period 04/21/2013, SpO2 98.00%. Physical Exam Heart- rrr Lungs- CTAB Abd- benign  Results for orders placed during the hospital encounter of 06/01/13 (from the past 24 hour(s))  PREGNANCY, URINE     Status: None   Collection Time    06/01/13  6:35 AM      Result Value Range   Preg Test, Ur NEGATIVE  NEGATIVE    No results found.  Assessment/Plan: DUB- for  endometrial ablation.  She understands the risks of surgery, including, but not to infection, bleeding, DVTs, damage to bowel, bladder, ureters. She wishes to proceed.    [name] C. 06/01/2013, 7:22 AM

## 2013-06-02 ENCOUNTER — Encounter (HOSPITAL_COMMUNITY): Payer: Self-pay | Admitting: Obstetrics & Gynecology

## 2013-06-10 ENCOUNTER — Ambulatory Visit: Payer: No Typology Code available for payment source | Attending: Surgery | Admitting: Occupational Therapy

## 2013-06-10 DIAGNOSIS — Z4789 Encounter for other orthopedic aftercare: Secondary | ICD-10-CM | POA: Insufficient documentation

## 2013-06-10 DIAGNOSIS — M25649 Stiffness of unspecified hand, not elsewhere classified: Secondary | ICD-10-CM | POA: Insufficient documentation

## 2013-06-16 ENCOUNTER — Ambulatory Visit: Payer: Self-pay

## 2013-06-16 ENCOUNTER — Ambulatory Visit (HOSPITAL_COMMUNITY)
Admission: RE | Admit: 2013-06-16 | Discharge: 2013-06-16 | Disposition: A | Discharge status: Home / Self Care | Payer: No Typology Code available for payment source | Source: Ambulatory Visit | Attending: Obstetrics & Gynecology | Admitting: Obstetrics & Gynecology

## 2013-06-16 DIAGNOSIS — Z1231 Encounter for screening mammogram for malignant neoplasm of breast: Secondary | ICD-10-CM | POA: Insufficient documentation

## 2013-06-18 ENCOUNTER — Ambulatory Visit: Payer: No Typology Code available for payment source | Attending: Internal Medicine

## 2013-07-17 ENCOUNTER — Ambulatory Visit (INDEPENDENT_AMBULATORY_CARE_PROVIDER_SITE_OTHER): Payer: No Typology Code available for payment source | Admitting: Obstetrics & Gynecology

## 2013-07-17 ENCOUNTER — Encounter: Payer: Self-pay | Admitting: Obstetrics & Gynecology

## 2013-07-17 VITALS — BP 125/77 | HR 78 | Temp 98.1°F | Ht 67.0 in | Wt 133.7 lb

## 2013-07-17 DIAGNOSIS — Z09 Encounter for follow-up examination after completed treatment for conditions other than malignant neoplasm: Secondary | ICD-10-CM

## 2013-07-17 DIAGNOSIS — F17200 Nicotine dependence, unspecified, uncomplicated: Secondary | ICD-10-CM

## 2013-07-17 MED ORDER — BUPROPION HCL 100 MG PO TABS: 100.0000 mg | ORAL_TABLET | Freq: Two times a day (BID) | ORAL | Status: DC

## 2013-07-17 NOTE — Patient Instructions (Signed)
Smoking Cessation, Tips for Success If you are ready to quit smoking, congratulations! You have chosen to help yourself be healthier. Cigarettes bring nicotine, tar, carbon monoxide, and other irritants into your body. Your lungs, heart, and blood vessels will be able to work better without these poisons. There are many different ways to quit smoking. Nicotine gum, nicotine patches, a nicotine inhaler, or nicotine nasal spray can help with physical craving. Hypnosis, support groups, and medicines help break the habit of smoking. WHAT THINGS CAN I DO TO MAKE QUITTING EASIER?  Here are some tips to help you quit for good:  Pick a date when you will quit smoking completely. Tell all of your friends and family about your plan to quit on that date.  Do not try to slowly cut down on the number of cigarettes you are smoking. Pick a quit date and quit smoking completely starting on that day.  Throw away all cigarettes.   Clean and remove all ashtrays from your home, work, and car.   On a card, write down your reasons for quitting. Carry the card with you and read it when you get the urge to smoke.   Cleanse your body of nicotine. Drink enough water and fluids to keep your urine clear or pale yellow. Do this after quitting to flush the nicotine from your body.   Learn to predict your moods. Do not let a bad situation be your excuse to have a cigarette. Some situations in your life might tempt you into wanting a cigarette.   Never have "just one" cigarette. It leads to wanting another and another. Remind yourself of your decision to quit.   Change habits associated with smoking. If you smoked while driving or when feeling stressed, try other activities to replace smoking. Stand up when drinking your coffee. Brush your teeth after eating. Sit in a different chair when you read the paper. Avoid alcohol while trying to quit, and try to drink fewer caffeinated beverages. Alcohol and caffeine may urge  you to smoke.   Avoid foods and drinks that can trigger a desire to smoke, such as sugary or spicy foods and alcohol.   Ask people who smoke not to smoke around you.   Have something planned to do right after eating or having a cup of coffee. For example, plan to take a walk or exercise.   Try a relaxation exercise to calm you down and decrease your stress. Remember, you may be tense and nervous for the first 2 weeks after you quit, but this will pass.   Find new activities to keep your hands busy. Play with a pen, coin, or rubber band. Doodle or draw things on paper.   Brush your teeth right after eating. This will help cut down on the craving for the taste of tobacco after meals. You can also try mouthwash.   Use oral substitutes in place of cigarettes. Try using lemon drops, carrots, cinnamon sticks, or chewing gum. Keep them handy so they are available when you have the urge to smoke.   When you have the urge to smoke, try deep breathing.   Designate your home as a nonsmoking area.   If you are a heavy smoker, ask your health care provider about a prescription for nicotine chewing gum. It can ease your withdrawal from nicotine.   Reward yourself. Set aside the cigarette money you save and buy yourself something nice.   Look for support from others. Join a support group or   smoking cessation program. Ask someone at home or at work to help you with your plan to quit smoking.   Always ask yourself, "Do I need this cigarette or is this just a reflex?" Tell yourself, "Today, I choose not to smoke," or "I do not want to smoke." You are reminding yourself of your decision to quit.  Do not replace cigarette smoking with electronic cigarettes (commonly called e-cigarettes). The safety of e-cigarettes is unknown, and some may contain harmful chemicals.  If you relapse, do not give up! Plan ahead and think about what you will do the next time you get the urge to smoke.  HOW WILL  I FEEL WHEN I QUIT SMOKING? You may have symptoms of withdrawal because your body is used to nicotine (the addictive substance in cigarettes). You may crave cigarettes, be irritable, feel very hungry, cough often, get headaches, or have difficulty concentrating. The withdrawal symptoms are only temporary. They are strongest when you first quit but will go away within 10 14 days. When withdrawal symptoms occur, stay in control. Think about your reasons for quitting. Remind yourself that these are signs that your body is healing and getting used to being without cigarettes. Remember that withdrawal symptoms are easier to treat than the major diseases that smoking can cause.  Even after the withdrawal is over, expect periodic urges to smoke. However, these cravings are generally short lived and will go away whether you smoke or not. Do not smoke!  WHAT RESOURCES ARE AVAILABLE TO HELP ME QUIT SMOKING? Your health care provider can direct you to community resources or hospitals for support, which may include:  Group support.  Education.  Hypnosis.  Therapy. Document Released: 03/16/2004 Document Revised: 04/08/2013 Document Reviewed: 12/04/2012 [employer] Patient Information 2014 [employer].  

## 2013-07-17 NOTE — Progress Notes (Signed)
   Subjective:    Patient ID: [name], female    DOB: [birth year], 48 y.o.   MRN: [id number]  HPI  [name] is now 6 weeks po s/p Novasure ablation. She is happy with the results, no bleeding since surgery. She denies any post op complications. She is interested in smoking cessation. Her husband does smoke.  Review of Systems She declines a flu vaccine Her mammogram was normal 12/14    Objective:   Physical Exam        Assessment & Plan:  Post op doing well Smoking cessation- start Wellbutrin RTC 1 month

## 2013-08-19 ENCOUNTER — Ambulatory Visit: Payer: No Typology Code available for payment source | Admitting: Obstetrics & Gynecology

## 2014-02-14 ENCOUNTER — Encounter (HOSPITAL_COMMUNITY): Payer: Self-pay | Admitting: Emergency Medicine

## 2014-02-14 ENCOUNTER — Emergency Department (HOSPITAL_COMMUNITY)
Admission: EM | Admit: 2014-02-14 | Discharge: 2014-02-15 | Disposition: A | Discharge status: Home / Self Care | Payer: No Typology Code available for payment source | Attending: Emergency Medicine | Admitting: Emergency Medicine

## 2014-02-14 DIAGNOSIS — Y9389 Activity, other specified: Secondary | ICD-10-CM | POA: Insufficient documentation

## 2014-02-14 DIAGNOSIS — IMO0001 Reserved for inherently not codable concepts without codable children: Secondary | ICD-10-CM | POA: Insufficient documentation

## 2014-02-14 DIAGNOSIS — Y92009 Unspecified place in unspecified non-institutional (private) residence as the place of occurrence of the external cause: Secondary | ICD-10-CM | POA: Insufficient documentation

## 2014-02-14 DIAGNOSIS — S91109A Unspecified open wound of unspecified toe(s) without damage to nail, initial encounter: Secondary | ICD-10-CM | POA: Diagnosis not present

## 2014-02-14 DIAGNOSIS — F17200 Nicotine dependence, unspecified, uncomplicated: Secondary | ICD-10-CM | POA: Diagnosis not present

## 2014-02-14 DIAGNOSIS — R509 Fever, unspecified: Secondary | ICD-10-CM | POA: Insufficient documentation

## 2014-02-14 DIAGNOSIS — T63001A Toxic effect of unspecified snake venom, accidental (unintentional), initial encounter: Secondary | ICD-10-CM | POA: Insufficient documentation

## 2014-02-14 DIAGNOSIS — T63121A Toxic effect of venom of other venomous lizard, accidental (unintentional), initial encounter: Secondary | ICD-10-CM | POA: Insufficient documentation

## 2014-02-14 LAB — CBC WITH DIFFERENTIAL/PLATELET
Basophils Absolute: 0 10*3/uL (ref 0.0–0.1)
Basophils Relative: 0 % (ref 0–1)
EOS PCT: 1 % (ref 0–5)
Eosinophils Absolute: 0.1 10*3/uL (ref 0.0–0.7)
HCT: 40.5 % (ref 36.0–46.0)
Hemoglobin: 14 g/dL (ref 12.0–15.0)
LYMPHS ABS: 1.4 10*3/uL (ref 0.7–4.0)
Lymphocytes Relative: 15 % (ref 12–46)
MCH: 33.1 pg (ref 26.0–34.0)
MCHC: 34.6 g/dL (ref 30.0–36.0)
MCV: 95.7 fL (ref 78.0–100.0)
Monocytes Absolute: 0.6 10*3/uL (ref 0.1–1.0)
Monocytes Relative: 7 % (ref 3–12)
NEUTROS ABS: 7.5 10*3/uL (ref 1.7–7.7)
NEUTROS PCT: 77 % (ref 43–77)
Platelets: 214 10*3/uL (ref 150–400)
RBC: 4.23 MIL/uL (ref 3.87–5.11)
RDW: 12.5 % (ref 11.5–15.5)
WBC: 9.6 10*3/uL (ref 4.0–10.5)

## 2014-02-14 MED ORDER — SODIUM CHLORIDE 0.9 % IV BOLUS (SEPSIS)
1000.0000 mL | Freq: Once | INTRAVENOUS | Status: AC
  Administered 2014-02-14: 1000 mL via INTRAVENOUS

## 2014-02-14 MED ORDER — ONDANSETRON HCL 4 MG/2ML IJ SOLN
4.0000 mg | Freq: Once | INTRAMUSCULAR | Status: AC
  Administered 2014-02-15: 4 mg via INTRAVENOUS
  Filled 2014-02-14: qty 2

## 2014-02-14 MED ORDER — SODIUM CHLORIDE 0.9 % IV SOLN
Freq: Once | INTRAVENOUS | Status: AC
  Administered 2014-02-15: 01:00:00 via INTRAVENOUS

## 2014-02-14 MED ORDER — HYDROMORPHONE HCL PF 1 MG/ML IJ SOLN
1.0000 mg | Freq: Once | INTRAMUSCULAR | Status: AC
  Administered 2014-02-14: 1 mg via INTRAVENOUS
  Filled 2014-02-14: qty 1

## 2014-02-14 NOTE — ED Notes (Signed)
Patient presents to ED via [facility]. Patient states that she was walking outside with flip-flops on and was bit on the right great toe by a "copperhead." Patient has notable fang marks, swelling and redness to right lower leg. +PMS. Last marked by EMS at 2310. EMS gave 250 mcg fentanyl. Pt is A&Ox4. No acute distress noted at this time.

## 2014-02-15 LAB — COMPREHENSIVE METABOLIC PANEL
ALBUMIN: 3.8 g/dL (ref 3.5–5.2)
ALT: 9 U/L (ref 0–35)
ANION GAP: 13 (ref 5–15)
AST: 11 U/L (ref 0–37)
Alkaline Phosphatase: 61 U/L (ref 39–117)
BILIRUBIN TOTAL: 0.3 mg/dL (ref 0.3–1.2)
BUN: 13 mg/dL (ref 6–23)
CHLORIDE: 105 meq/L (ref 96–112)
CO2: 21 mEq/L (ref 19–32)
Calcium: 9.2 mg/dL (ref 8.4–10.5)
Creatinine, Ser: 0.94 mg/dL (ref 0.50–1.10)
GFR calc non Af Amer: 72 mL/min — ABNORMAL LOW (ref >90)
GFR, EST AFRICAN AMERICAN: 84 mL/min — AB (ref >90)
GLUCOSE: 122 mg/dL — AB (ref 70–99)
POTASSIUM: 3.6 meq/L — AB (ref 3.7–5.3)
Sodium: 139 mEq/L (ref 137–147)
Total Protein: 6.2 g/dL (ref 6.0–8.3)

## 2014-02-15 LAB — D-DIMER, QUANTITATIVE: D-Dimer, Quant: <0.27 ug/mL-FEU (ref 0.00–0.48)

## 2014-02-15 LAB — PROTIME-INR
INR: 1.09 (ref 0.00–1.49)
Prothrombin Time: 14.1 seconds (ref 11.6–15.2)

## 2014-02-15 LAB — TYPE AND SCREEN
ABO/RH(D): O POS
Antibody Screen: NEGATIVE

## 2014-02-15 LAB — FIBRINOGEN: Fibrinogen: 255 mg/dL (ref 204–475)

## 2014-02-15 LAB — ABO/RH: ABO/RH(D): O POS

## 2014-02-15 LAB — APTT: aPTT: 27 seconds (ref 24–37)

## 2014-02-15 MED ORDER — HYDROMORPHONE HCL PF 1 MG/ML IJ SOLN
1.0000 mg | Freq: Once | INTRAMUSCULAR | Status: AC
  Administered 2014-02-15: 1 mg via INTRAVENOUS
  Filled 2014-02-15: qty 1

## 2014-02-15 MED ORDER — HYDROCODONE-ACETAMINOPHEN 5-325 MG PO TABS
1.0000 | ORAL_TABLET | Freq: Once | ORAL | Status: AC
  Administered 2014-02-15: 1 via ORAL
  Filled 2014-02-15: qty 1

## 2014-02-15 MED ORDER — HYDROCODONE-ACETAMINOPHEN 5-325 MG PO TABS: 1.0000 | ORAL_TABLET | Freq: Four times a day (QID) | ORAL | Status: DC | PRN

## 2014-02-15 MED ORDER — IBUPROFEN 400 MG PO TABS
600.0000 mg | ORAL_TABLET | Freq: Once | ORAL | Status: AC
  Administered 2014-02-15: 600 mg via ORAL
  Filled 2014-02-15 (×2): qty 1

## 2014-02-15 MED ORDER — ONDANSETRON 8 MG PO TBDP: 8.0000 mg | ORAL_TABLET | Freq: Three times a day (TID) | ORAL | Status: DC | PRN

## 2014-02-15 MED ORDER — BACITRACIN-NEOMYCIN-POLYMYXIN 400-5-5000 EX OINT: 1.0000 "application " | TOPICAL_OINTMENT | Freq: Two times a day (BID) | CUTANEOUS | Status: AC

## 2014-02-15 MED ORDER — BACITRACIN ZINC 500 UNIT/GM EX OINT: TOPICAL_OINTMENT | Freq: Two times a day (BID) | CUTANEOUS | Status: DC

## 2014-02-15 MED ORDER — ONDANSETRON HCL 4 MG/2ML IJ SOLN
4.0000 mg | Freq: Once | INTRAMUSCULAR | Status: AC
  Administered 2014-02-15: 4 mg via INTRAVENOUS
  Filled 2014-02-15: qty 2

## 2014-02-15 MED ORDER — IBUPROFEN 600 MG PO TABS: 600.0000 mg | ORAL_TABLET | Freq: Four times a day (QID) | ORAL | Status: DC | PRN

## 2014-02-15 NOTE — ED Provider Notes (Signed)
CSN: [id number]     Arrival date & time 02/14/14  2314 History   First MD Initiated Contact with Patient 02/14/14 2315     Chief Complaint  Patient presents with  . Snake Bite     (Consider location/radiation/quality/duration/timing/severity/associated sxs/prior Treatment) HPI Comments: PT comes in after being bit by a copper head snake. She reports that she got bit when she opened her house door. The bite is to the right great toe. Pt went to the closed fire department, and was asked to come to the ER. Pt's husband killed the snake - and states that it was a copper head for sure. Pt has no medical hx. She reports throbbing pain to the great toe, about 8/10.  Snake bite around 10:15 pm. No dib, nausea, chest pain, abd pain, chills, confusion.   The history is provided by the patient.    History reviewed. No pertinent past medical history. Past Surgical History  Procedure Laterality Date  . Cesarian    . Cesarean section    . Novasure ablation N/A 06/01/2013    Procedure: NOVASURE ABLATION;  Surgeon: Allie Bossier, MD;  Location: [facility];  Service: Gynecology;  Laterality: N/A;   No family history on file. History  Substance Use Topics  . Smoking status: Current Every Day Smoker -- 1.00 packs/day    Types: Cigarettes  . Smokeless tobacco: Not on file  . Alcohol Use: Yes     Comment: occasional   OB History   Grav Para Term Preterm Abortions TAB SAB Ect Mult Living   2 1 1  1  1   1      Review of Systems  Constitutional: Positive for fever and activity change. Negative for chills.  HENT: Negative for facial swelling.   Respiratory: Negative for cough, shortness of breath and wheezing.   Cardiovascular: Negative for chest pain.  Gastrointestinal: Negative for nausea, vomiting, abdominal pain, diarrhea, constipation, blood in stool and abdominal distention.  Genitourinary: Negative for hematuria and difficulty urinating.  Musculoskeletal: Positive for myalgias. Negative for neck  pain.  Skin: Positive for rash and wound. Negative for color change.  Neurological: Negative for speech difficulty.  Hematological: Does not bruise/bleed easily.  Psychiatric/Behavioral: Negative for confusion.      Allergies  Review of patient's allergies indicates no known allergies.  Home Medications   Prior to Admission medications   Medication Sig Start Date End Date Taking? Authorizing Provider  HYDROcodone-acetaminophen (NORCO/VICODIN) 5-325 MG per tablet Take 1 tablet by mouth every 6 (six) hours as needed. 02/15/14   Derwood Kaplan, MD  ibuprofen (ADVIL,MOTRIN) 600 MG tablet Take 1 tablet (600 mg total) by mouth every 6 (six) hours as needed. 02/15/14   Derwood Kaplan, MD  neomycin-bacitracin-polymyxin (NEOSPORIN) ointment Apply 1 application topically every 12 (twelve) hours. apply to eye 02/15/14   Ourania Hamler, MD   BP 127/64  Pulse 63  Temp(Src) 98.2 F (36.8 C) (Oral)  Resp 16  SpO2 98% Physical Exam  Nursing note and vitals reviewed. Constitutional: She is oriented to person, place, and time. She appears well-developed and well-nourished.  HENT:  Head: Normocephalic and atraumatic.  Eyes: EOM are normal. Pupils are equal, round, and reactive to light.  Neck: Neck supple.  Cardiovascular: Normal rate, regular rhythm and normal heart sounds.   No murmur heard. Pulmonary/Chest: Effort normal. No respiratory distress.  Abdominal: Soft. She exhibits no distension. There is no tenderness. There is no rebound and no guarding.  Musculoskeletal:  Right great toe  has a bite wound with some bleeding. Pt has swelling of the toe and midfoot at the time of arrival - with some ecchymoses (hyperpigmentation), and edema and ecchymoses of the foot. Sensory exam is normal. Tenderness to palpation. 2+ DP.  Neurological: She is alert and oriented to person, place, and time.  Skin: Skin is warm and dry.     ED Course  Procedures (including critical care time) Labs  Review Labs Reviewed  COMPREHENSIVE METABOLIC PANEL - Abnormal; Notable for the following:    Potassium 3.6 (*)    Glucose, Bld 122 (*)    GFR calc non Af Amer 72 (*)    GFR calc Af Amer 84 (*)    All other components within normal limits  CBC WITH DIFFERENTIAL  APTT  PROTIME-INR  D-DIMER, QUANTITATIVE  FIBRINOGEN  TYPE AND SCREEN  ABO/RH    Imaging Review No results found.   EKG Interpretation None      # arrival - called poison control - they will send their protocol. @arrival  - [facility] - 2 for localized wound @12 :15: - [facility], 2 for wound and 1 for the nausea. @1 :25 - [facility] - [facility], 2 for wound. @2 :15: Score of 2. @3 :20: score of 3, 2 for wound and 1 for nausea  Score of 0-4 considered mild effects from the snake envenomation, and no need for crofab.  I spoke with the Poison control again after 1:25 check - to see if A. The crofab had a window that it needed to be administered (answer NO). Pt's swelling has worsened overtime, but is still limited to the foot, and not affecting the ankle yet. Leg is elevated. Patient has ecchymoses and edema with normal sensory exam. Throbbing pain, limited to the toe - pain free after the 2nd dilaudid.  Plan is to continue monitoring throughout my shift. The RN and i have been checking patient q1 hour. CroFab on hold for now. If the swelling gets to the ankle or above, we will admit with CroFab.  No dib, chest pain, emesis, CNS involvement.  6:19 AM Pt's 5 pm check showed no increase in swelling, or erythema. In fact no marked increase in erythema or swelling since 2:15. No compartment syndrome signs. Pt observed for the entire duration of my shift, and she will be discharged. Return precautions discussed. Ortho f/u given.           MDM   Final diagnoses:  Snake bite, accidental or unintentional, initial encounter    Pt come in post snake bite.  Has a localized  reaction, with extention into the ankle joint over the first 4 hours, and then no further vertical expansion since. I had called poison control x 2 times, and we have been getting serial measurements. There have been no systemic signs besides nausea -and her severity score for CROFAB indication has been always mild (less than 4), whilst in the ER. Although i had confirmed the presence of crofab in the hospital and pharmacy was ready if medicine was needed -there is no hard indication. All the initial labs are normal as well.  Pt is discharged. Return precautions have been discussed.  CRITICAL CARE Performed by: Derwood KaplanNanavati, Alisia Vanengen   Total critical care time: 90 minutes  Critical care time was exclusive of separately billable procedures and treating other patients.  Critical care was necessary to treat or prevent imminent or life-threatening deterioration.  Critical care  was time spent personally by me on the following activities: development of treatment plan with patient and/or surrogate as well as nursing, discussions with consultants, evaluation of patient's response to treatment, examination of patient, obtaining history from patient or surrogate, ordering and performing treatments and interventions, ordering and review of laboratory studies, ordering and review of radiographic studies, pulse oximetry and re-evaluation of patient's condition.    Derwood Kaplan, MD 02/15/14 [phone]

## 2014-02-15 NOTE — ED Notes (Signed)
Spoke with Onalee Huaavid at poison control No new orders given at this time.

## 2014-02-15 NOTE — Discharge Instructions (Signed)
We saw you in the ER for the snake bite. As discussed, anticipate a long time for the healing to go down. Use RICE therapy (read below). Please return to the ER if your symptoms worsen; you have increased pain, fevers, chills, inability to keep any medications down, confusion. Otherwise see the outpatient doctor as requested. READ THE INFORMATION ON COMPARTMENT SYNDROME BELOW - RETURN IF YOU HAVE ANY SIGNS OF COMPARTMENT SYNDROME.   Snake Bite Snakes may be either venomous (containing poison) or nonvenomous (nonpoisonous). A nonvenomous snake bite will cause trauma or a wound to the skin and possibly the deeper tissues. A venomous snake will also cause a traumatic wound, but more importantly, it may have injected venom into the wound. Snake bite venom can be extremely serious and even deadly. One type of venom may cause major skin, tissue and muscle damage, and failure of normal blood clotting. This may cause extreme swelling and pain of the affected area. Another type of venom can affect the brain and nervous system and may cause death. The treatment for venomous snake bite may require the use of antivenom medicine. If you are unsure if your bite is from a venomous snake, you MUST seek immediate medical attention. YOU MIGHT NEED A TETANUS SHOT NOW IF:  You have no idea when you had the last one.  You have never had a tetanus shot before.  The bite broke your skin. If you need a tetanus shot, and you decide not to get one, there is a rare chance of getting tetanus. Sickness from tetanus can be serious. HOME CARE INSTRUCTIONS  A snake bit you and caused a skin wound. It may or may not have been venomous. If the snake was venomous, a small amount of venom may have been injected into your skin.  Keep the bite area clean and dry.  Keep the extremity elevated above the level of the heart for the next 48 hours.  Wash the bite area 3 times daily with soap and water or an antiseptic. Apply an adhesive  or gauze bandage to the bite area.  If you develop blistering of any type at the site of the bite, protect the blisters from breaking. Do not attempt to open it.  If you were given a tetanus shot, your arm may get swollen, red and warm at the shot site. This is a common response to the injection. SEEK IMMEDIATE MEDICAL CARE IF:   You develop symptoms of poisoning including increased pain, redness, swelling, blood blisters or purple spots in the bite area, nausea, vomiting, numbness, tingling, excessive sweating, breathing difficulty, blurred vision, feelings of lightheadedness, or feeling faint. If you develop symptoms of poisoning, you MUST seek immediate medical attention.  The bite becomes infected. Symptoms may include redness, swelling, pain, tenderness, pus, red streaks running from the wound, or an oral temperature above 102 F (38.9 C), not controlled by medicine.  Your condition or wound becomes worse. MAKE SURE YOU:   Understand these instructions.  Will watch your condition.  Will get help right away if you are not doing well or get worse. Document Released: 06/15/2000 Document Revised: 09/10/2011 Document Reviewed: 11/09/2009 [facility] Patient Information 2015 [place], [place]. This information is not intended to replace advice given to you by your health care provider. Make sure you discuss any questions you have with your health care provider. Compartment Syndrome of the Foot Compartment syndrome of the foot is a condition in which increased tissue pressure in a confined space in  your foot causes decreased blood flow. Decreased blood flow can lead to muscle weakness and loss of feeling in your foot. Compartments of your foot contain bones, muscles, blood vessels, and nerves that are wrapped tightly together by tough fibrous tissues (fascia). When an injury occurs to these compartments, there is no room for the tissue to swell. The dangerously high pressure in compartment syndrome  restricts the flow of blood to and from the injured areas. Having the syndrome can be an emergency requiring immediate surgery to prevent permanent injury. CAUSES A crushing type of injury to your foot. RISK FACTORS  Blood clots.  Surgery to blood vessels of your leg or foot.  Prolonged compression during a period of consciousness.  Extremely vigorous exercise.  Anabolic steroid use.  Trauma.  An infection throughout the body (sepsis).  Overly tight bandages or casts.  Bites from venomous animals like snakes.  Anticoagulant medicine use that leads to excessive bleeding into the compartments after an injury.  Burns. SIGNS AND SYMPTOMS  New and persistent deep ache.  Pain that is greater than expected for the severity of an injury.  Numbness, a "pins-and-needles," or electricity-like pain in the foot.  Swelling and tightness.  Bruising. DIAGNOSIS  Your health care provider will perform a physical exam. Sometimes pressures are taken within the compartment. TREATMENT  Treatment involves relief of pressure in the compartment by cutting the fascia that surrounds it (fasciotomy). You will remain in the hospital while the cuts (incisions) are usually left open for several days. When the swelling has gone away, your incision will be closed.  Document Released: 09/24/2000 Document Revised: 06/23/2013 Document Reviewed: 02/04/2013 [facility] Patient Information 2015 [place], [place]. This information is not intended to replace advice given to you by your health care provider. Make sure you discuss any questions you have with your health care provider. RICE: Routine Care for Injuries The routine care of many injuries includes Rest, Ice, Compression, and Elevation (RICE). HOME CARE INSTRUCTIONS  Rest is needed to allow your body to heal. Routine activities can usually be resumed when comfortable. Injured tendons and bones can take up to 6 weeks to heal. Tendons are the cord-like  structures that attach muscle to bone.  Ice following an injury helps keep the swelling down and reduces pain.  Put ice in a plastic bag.  Place a towel between your skin and the bag.  Leave the ice on for 15-20 minutes, 3-4 times a day, or as directed by your health care provider. Do this while awake, for the first 24 to 48 hours. After that, continue as directed by your caregiver.  Compression helps keep swelling down. It also gives support and helps with discomfort. If an elastic bandage has been applied, it should be removed and reapplied every 3 to 4 hours. It should not be applied tightly, but firmly enough to keep swelling down. Watch fingers or toes for swelling, bluish discoloration, coldness, numbness, or excessive pain. If any of these problems occur, remove the bandage and reapply loosely. Contact your caregiver if these problems continue.  Elevation helps reduce swelling and decreases pain. With extremities, such as the arms, hands, legs, and feet, the injured area should be placed near or above the level of the heart, if possible. SEEK IMMEDIATE MEDICAL CARE IF:  You have persistent pain and swelling.  You develop redness, numbness, or unexpected weakness.  Your symptoms are getting worse rather than improving after several days. These symptoms may indicate that further evaluation or further  X-rays are needed. Sometimes, X-rays may not show a small broken bone (fracture) until 1 week or 10 days later. Make a follow-up appointment with your caregiver. Ask when your X-ray results will be ready. Make sure you get your X-ray results. Document Released: 09/30/2000 Document Revised: 06/23/2013 Document Reviewed: 11/17/2010 [facility] Patient Information 2015 [place], [place]. This information is not intended to replace advice given to you by your health care provider. Make sure you discuss any questions you have with your health care provider.

## 2014-02-15 NOTE — ED Notes (Addendum)
See measurement sheet scanned into medical record.

## 2014-05-03 ENCOUNTER — Encounter (HOSPITAL_COMMUNITY): Payer: Self-pay | Admitting: Emergency Medicine

## 2014-08-31 ENCOUNTER — Emergency Department (HOSPITAL_COMMUNITY)
Admission: EM | Admit: 2014-08-31 | Discharge: 2014-08-31 | Disposition: A | Discharge status: Home / Self Care | Payer: Medicaid Other | Source: Home / Self Care | Attending: Family Medicine | Admitting: Family Medicine

## 2014-08-31 ENCOUNTER — Encounter (HOSPITAL_COMMUNITY): Payer: Self-pay | Admitting: *Deleted

## 2014-08-31 DIAGNOSIS — N390 Urinary tract infection, site not specified: Secondary | ICD-10-CM | POA: Diagnosis not present

## 2014-08-31 LAB — POCT URINALYSIS DIP (DEVICE)
Bilirubin Urine: NEGATIVE
Glucose, UA: NEGATIVE mg/dL
HGB URINE DIPSTICK: NEGATIVE
Ketones, ur: NEGATIVE mg/dL
LEUKOCYTES UA: NEGATIVE
NITRITE: NEGATIVE
PH: 6 (ref 5.0–8.0)
Protein, ur: NEGATIVE mg/dL
Specific Gravity, Urine: 1.02 (ref 1.005–1.030)
Urobilinogen, UA: 0.2 mg/dL (ref 0.0–1.0)

## 2014-08-31 MED ORDER — CEPHALEXIN 500 MG PO CAPS: 500.0000 mg | ORAL_CAPSULE | Freq: Four times a day (QID) | ORAL | Status: DC

## 2014-08-31 NOTE — Discharge Instructions (Signed)
Take all of medicine as directed, drink lots of fluids, see your doctor if further problems. °

## 2014-08-31 NOTE — ED Notes (Signed)
Pt  Reports    Symptoms  Of    Frequency  Of  Urination  With  A  Burning    Sensation    For  2  .5   Weeks         Pt   Ambulated  To    Room  With  A   Steady  Fluid  Gait  Speaking  In  Complete  sentances  And       Is  In no   Severe  Distress

## 2014-08-31 NOTE — ED Provider Notes (Signed)
CSN: [id number]     Arrival date & time 08/31/14  1003 History   First MD Initiated Contact with Patient 08/31/14 1105     Chief Complaint  Patient presents with  . Urinary Tract Infection   (Consider location/radiation/quality/duration/timing/severity/associated sxs/prior Treatment) Patient is a 49 y.o. female presenting with frequency. The history is provided by the patient.  Urinary Frequency This is a new problem. The current episode started more than 1 week ago (2.5 wks). The problem has been gradually worsening. Pertinent negatives include no chest pain.    History reviewed. No pertinent past medical history. Past Surgical History  Procedure Laterality Date  . Cesarian    . Cesarean section    . Novasure ablation N/A 06/01/2013    Procedure: NOVASURE ABLATION;  Surgeon: Allie BossierMyra C Dove, MD;  Location: [facility];  Service: Gynecology;  Laterality: N/A;   History reviewed. No pertinent family history. History  Substance Use Topics  . Smoking status: Current Every Day Smoker -- 1.00 packs/day    Types: Cigarettes  . Smokeless tobacco: Not on file  . Alcohol Use: Yes     Comment: occasional   OB History    Gravida Para Term Preterm AB TAB SAB Ectopic Multiple Living   2 1 1  1  1   1      Review of Systems  Constitutional: Negative.   Cardiovascular: Negative for chest pain.  Gastrointestinal: Negative.   Genitourinary: Positive for dysuria, frequency and flank pain. Negative for menstrual problem and pelvic pain.    Allergies  Review of patient's allergies indicates no known allergies.  Home Medications   Prior to Admission medications   Medication Sig Start Date End Date Taking? Authorizing Provider  cephALEXin (KEFLEX) 500 MG capsule Take 1 capsule (500 mg total) by mouth 4 (four) times daily. Take all of medicine and drink lots of fluids 08/31/14   Linna HoffJames D Kindl, MD  HYDROcodone-acetaminophen (NORCO/VICODIN) 5-325 MG per tablet Take 1 tablet by mouth every 6 (six) hours as  needed. 02/15/14   Derwood KaplanAnkit Nanavati, MD  ibuprofen (ADVIL,MOTRIN) 600 MG tablet Take 1 tablet (600 mg total) by mouth every 6 (six) hours as needed. 02/15/14   Derwood KaplanAnkit Nanavati, MD  neomycin-bacitracin-polymyxin (NEOSPORIN) ointment Apply 1 application topically every 12 (twelve) hours. apply to eye 02/15/14   Derwood KaplanAnkit Nanavati, MD  ondansetron (ZOFRAN ODT) 8 MG disintegrating tablet Take 1 tablet (8 mg total) by mouth every 8 (eight) hours as needed for nausea. 02/15/14   Ankit Nanavati, MD   BP 133/87 mmHg  Pulse 70  Temp(Src) 98 F (36.7 C) (Oral)  Resp 16  SpO2 98% Physical Exam  Constitutional: She is oriented to person, place, and time. She appears well-developed and well-nourished. No distress.  Neck: Normal range of motion. Neck supple.  Abdominal: Soft. Normal appearance and bowel sounds are normal. She exhibits no distension and no mass. There is tenderness in the suprapubic area. There is no rigidity, no rebound, no guarding, no CVA tenderness, no tenderness at McBurney's point and negative Murphy's sign.  Neurological: She is alert and oriented to person, place, and time.  Skin: Skin is warm and dry.  Nursing note and vitals reviewed.   ED Course  Procedures (including critical care time) Labs Review Labs Reviewed  POCT URINALYSIS DIP (DEVICE)    Imaging Review No results found.   MDM   1. UTI (lower urinary tract infection)        Linna HoffJames D Kindl, MD 08/31/14 1135

## 2014-12-18 IMAGING — CR DG HAND COMPLETE 3+V*R*
3 series · 3 of 3 positions shown · non-contrast
Comparison: None.

CLINICAL DATA: Animal bite to the right hand, with laceration at
the fifth finger and metacarpal.

RIGHT HAND - COMPLETE 3+ VIEW

[x hand pa right]
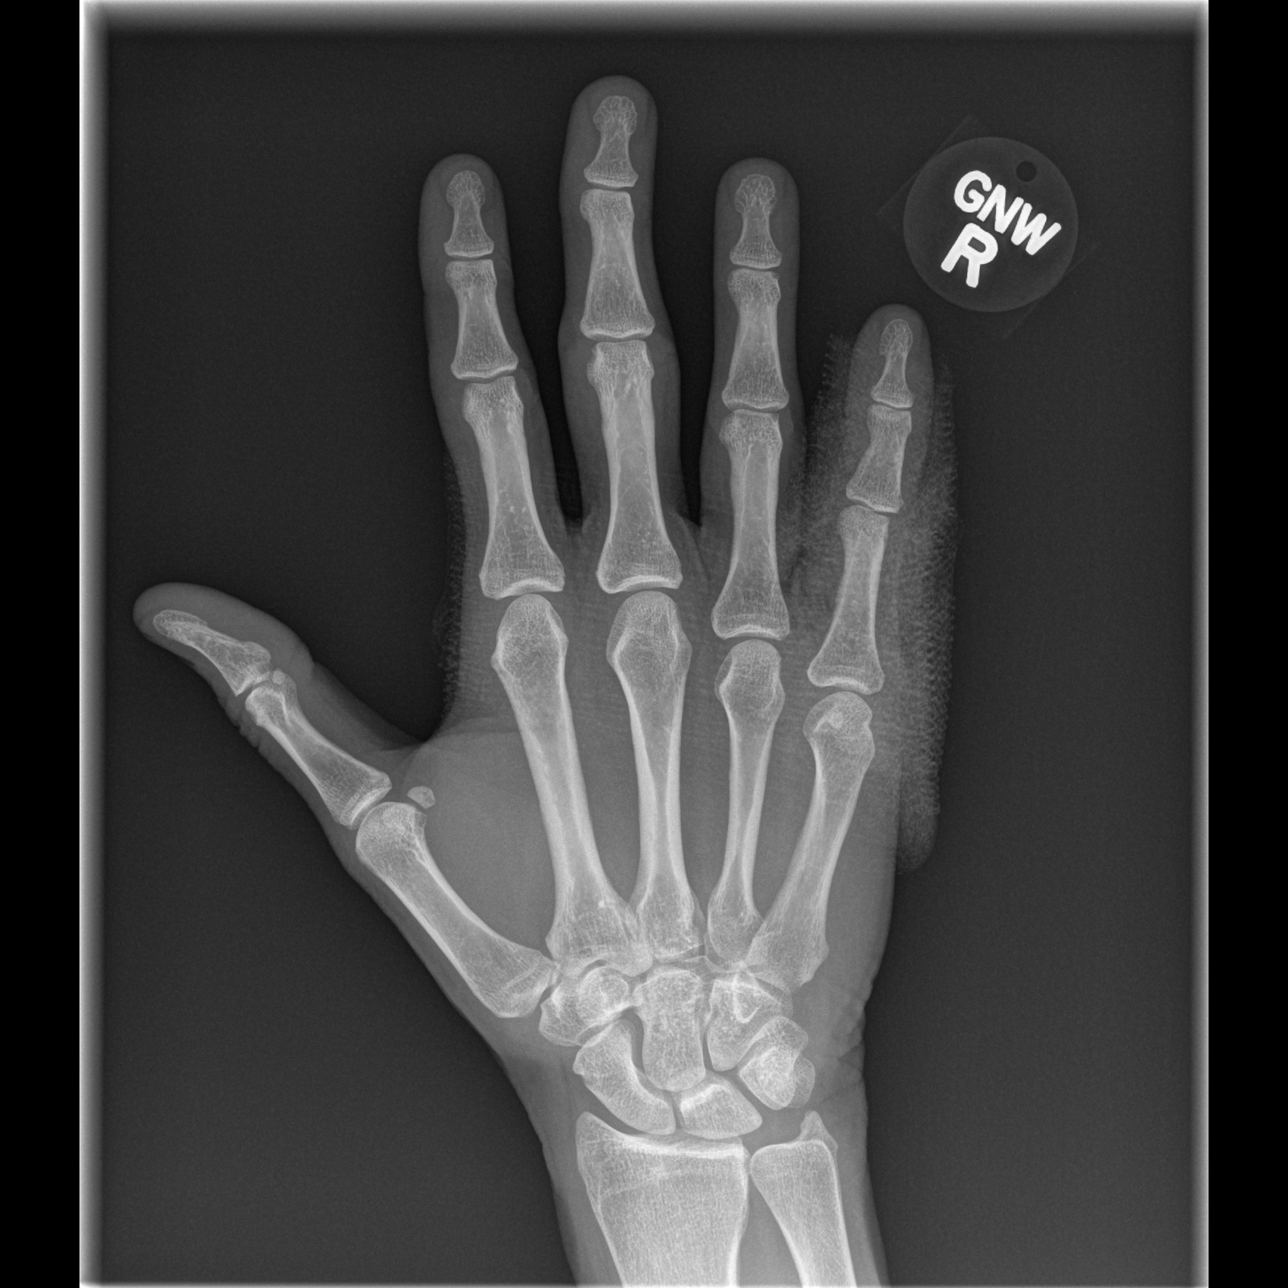

[x hand oblique right]
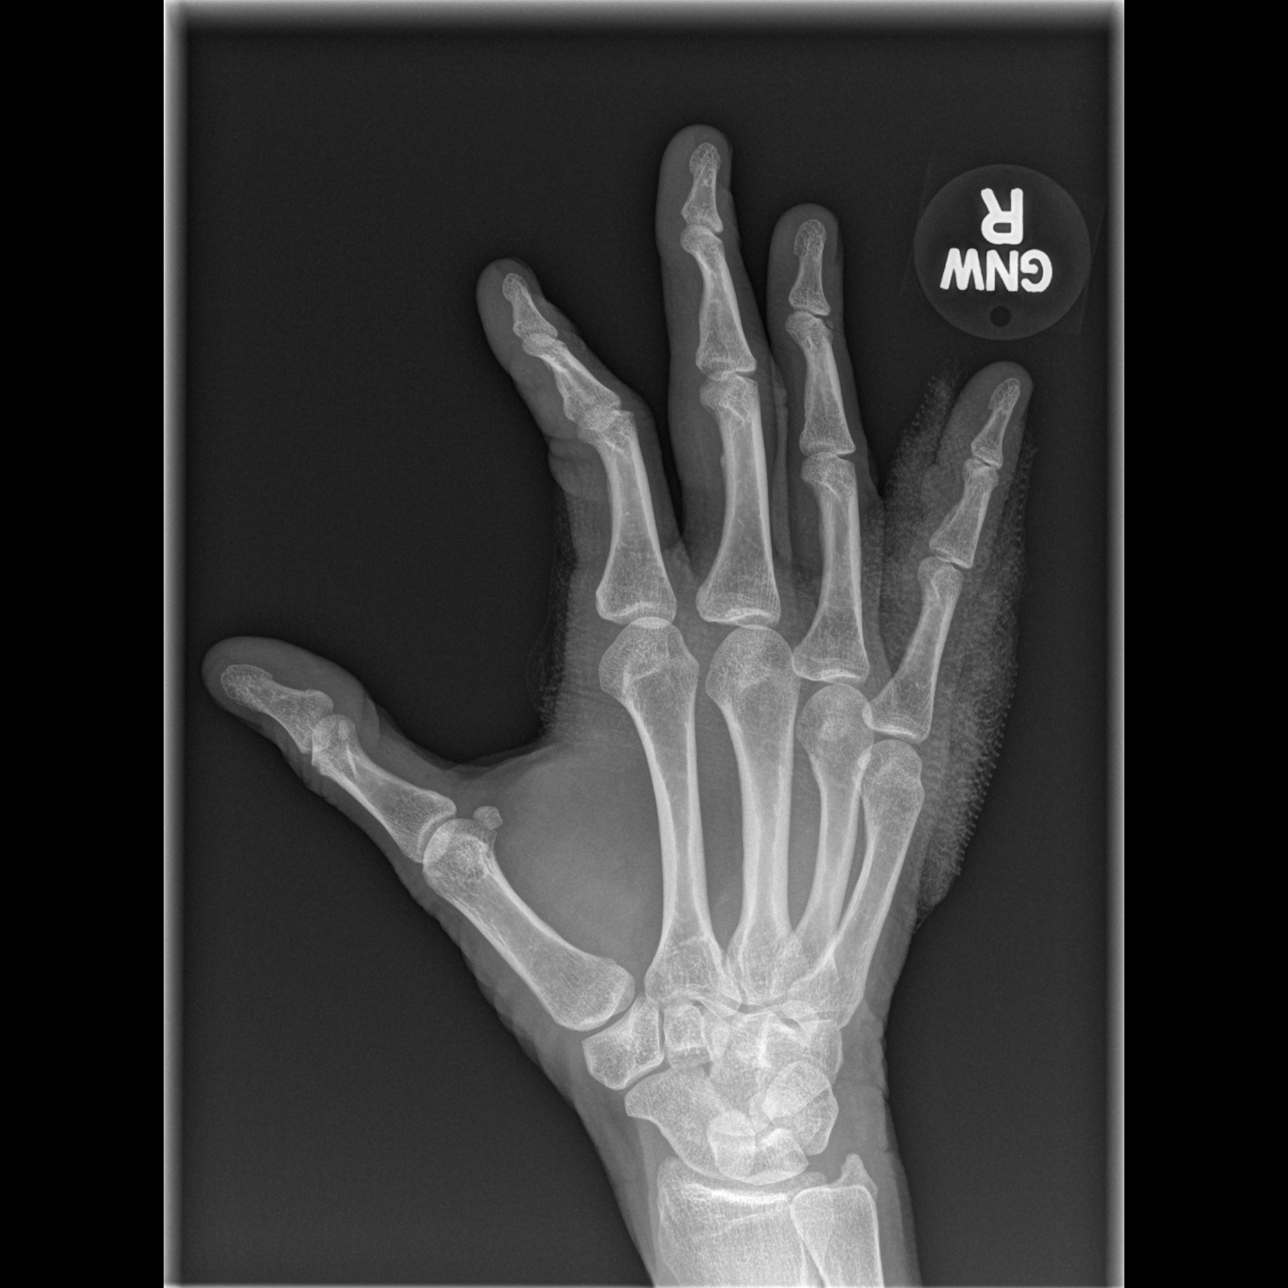

[x hand lat right]
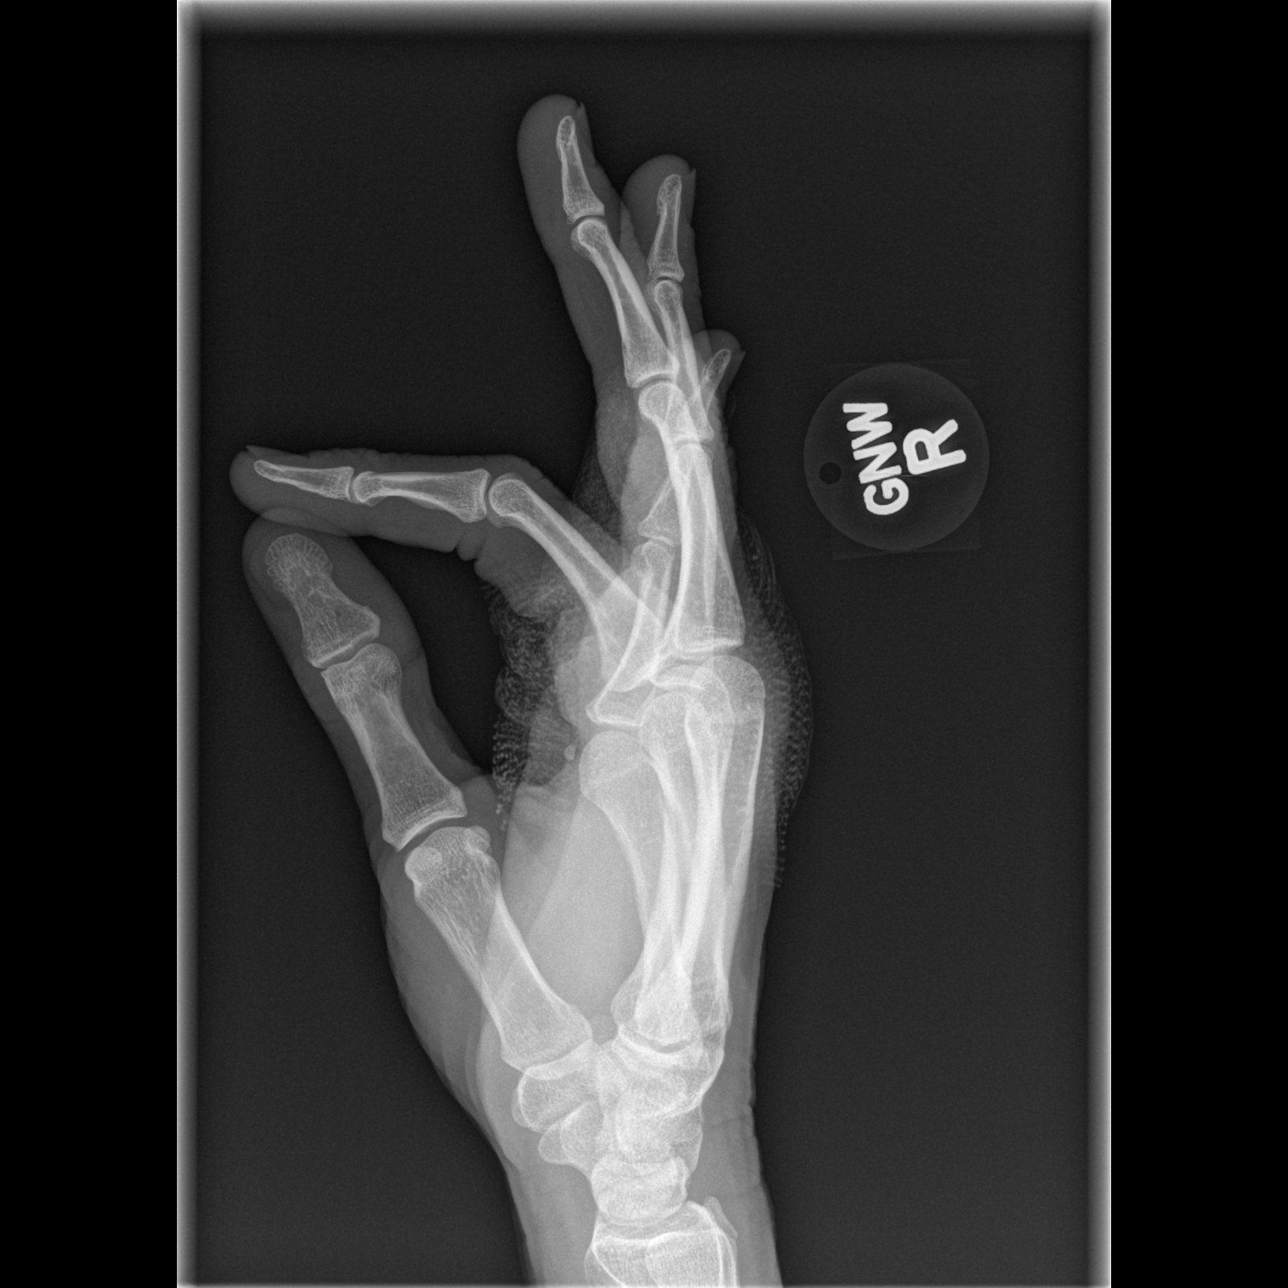

[3 of 3 positions shown; findings below may reference images not displayed]

FINDINGS: There is no definite evidence of osseous disruption.
Significant soft tissue disruption is noted along the fifth digit,
difficult to fully characterize due to the overlying bandage.
Visualized joint spaces are grossly preserved.  No radiopaque
foreign bodies are seen.

The carpal rows appear grossly intact, and demonstrate normal
alignment.
IMPRESSION: No definite evidence of osseous disruption; no radiopaque foreign
bodies seen.

## 2015-04-07 IMAGING — US US TRANSVAGINAL NON-OB
1 series · 13 of 25 positions shown · non-contrast
Comparison: None

CLINICAL DATA: Dysfunctional uterine bleeding. LMP 02/21/2013.

EXAM:
TRANSABDOMINAL AND TRANSVAGINAL ULTRASOUND OF PELVIS
TECHNIQUE: Both transabdominal and transvaginal ultrasound examinations of the
pelvis were performed. Transabdominal technique was performed for
global imaging of the pelvis including uterus, ovaries, adnexal
regions, and pelvic cul-de-sac. It was necessary to proceed with
endovaginal exam following the transabdominal exam to visualize the
endometrium and ovaries.

[Series 1: us pelvis complete · 13 of 81 slices shown]
[im 1/81]
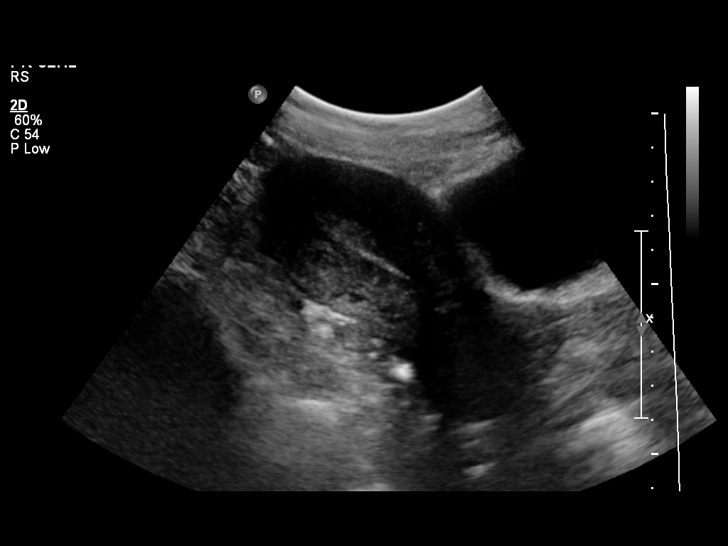
[im 7/81]
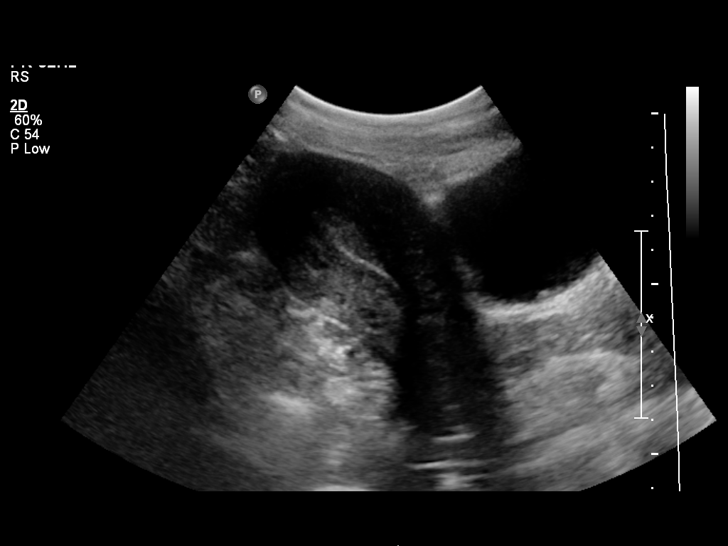
[im 14/81]
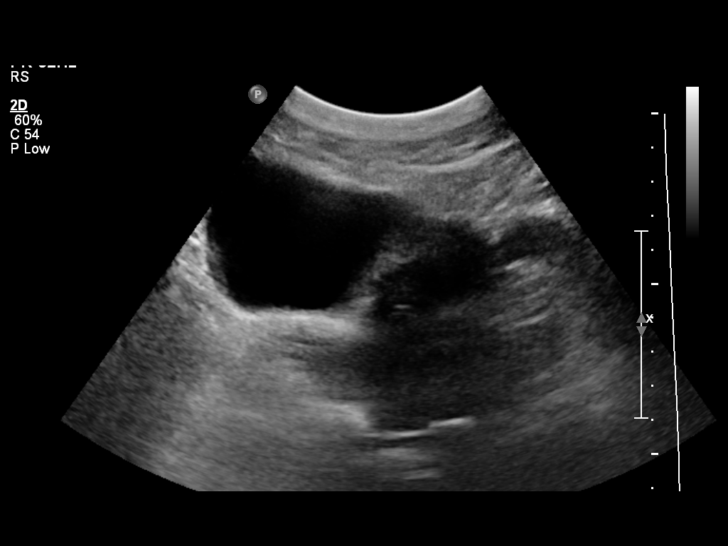
[im 21/81]
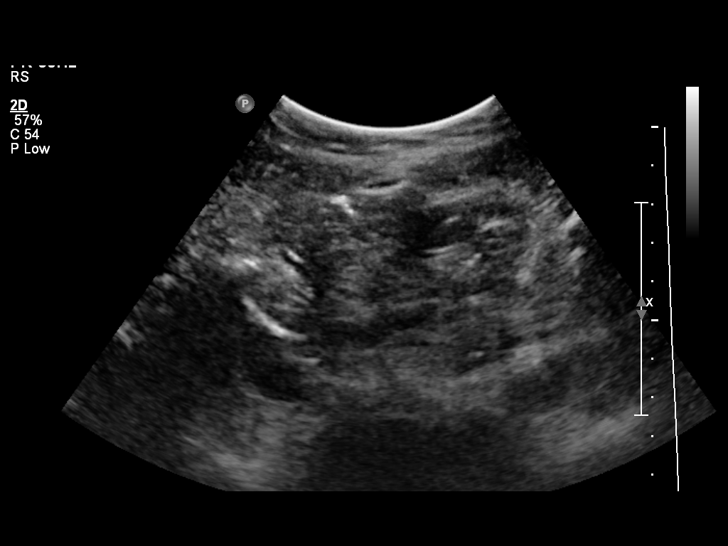
[im 27/81]
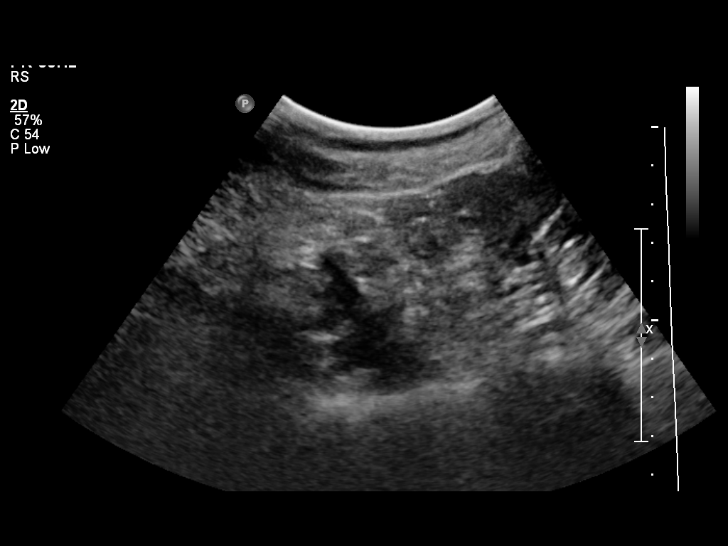
[im 34/81]
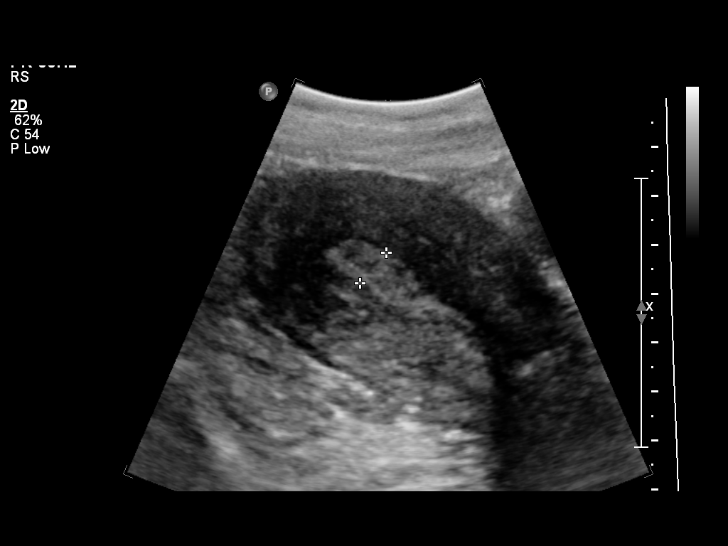
[im 41/81]
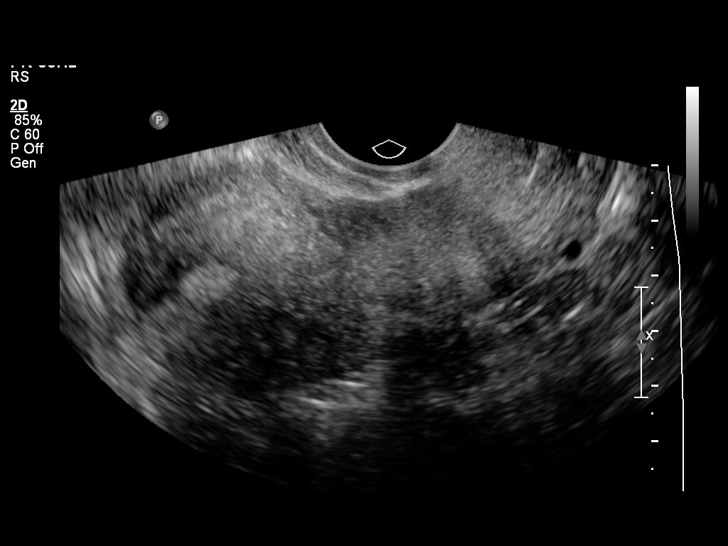
[im 47/81]
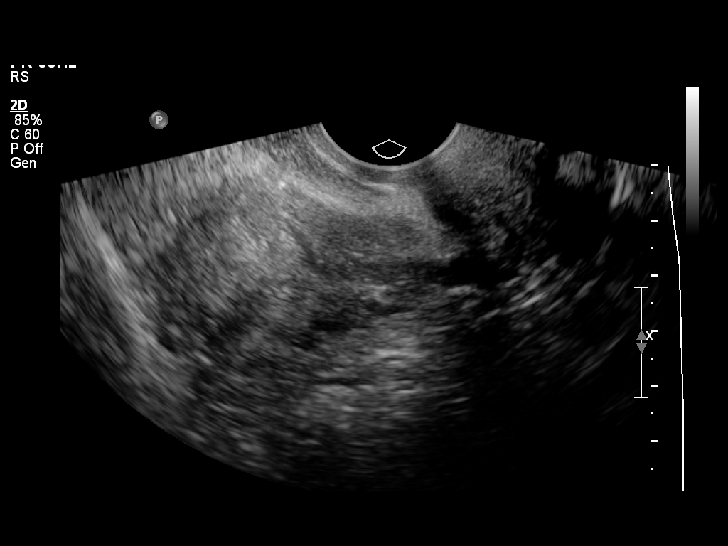
[im 54/81]
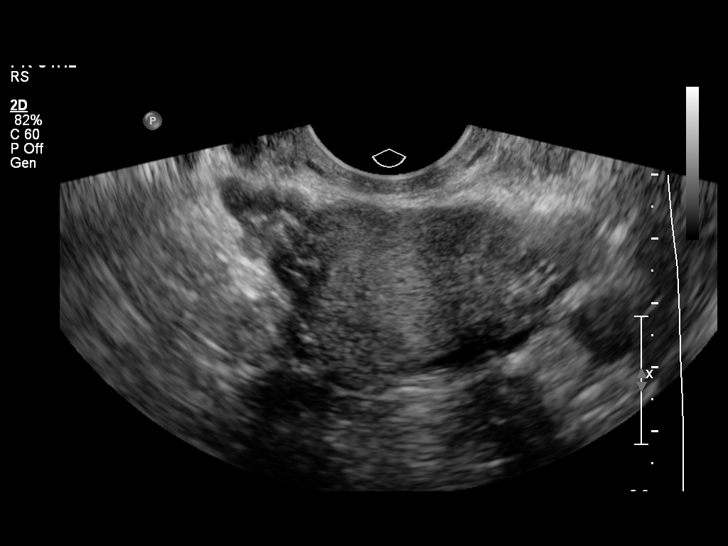
[im 61/81]
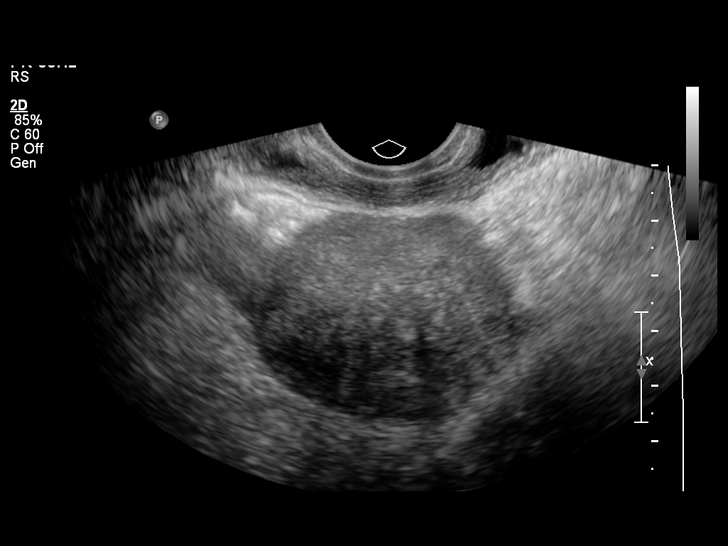
[im 67/81]
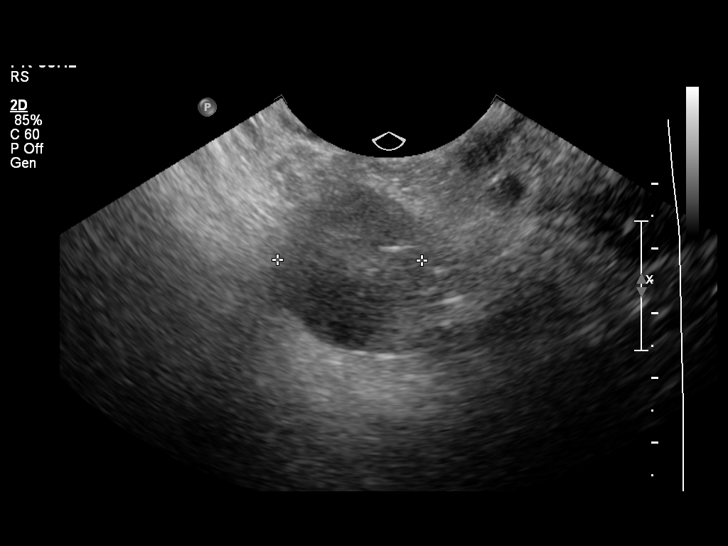
[im 74/81]
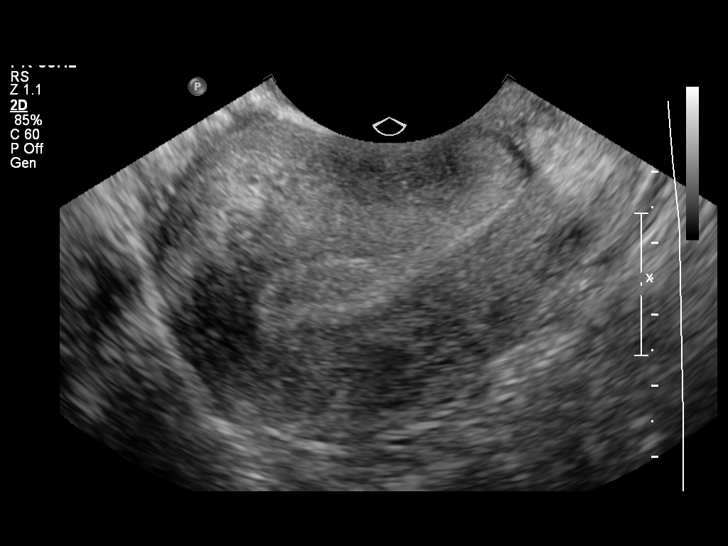
[im 81/81]
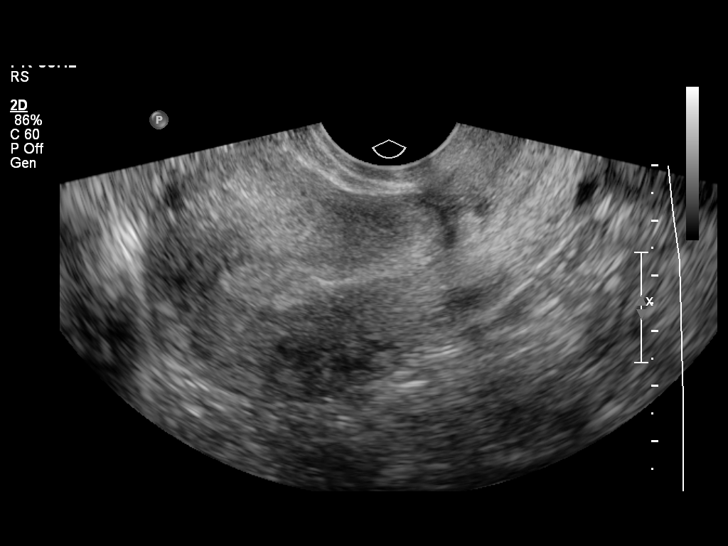

[13 of 25 positions shown; findings below may reference images not displayed]

FINDINGS: Uterus

Measurements: 9.3 x 4.6 by 5.8 cm. No fibroids or other mass
visualized.

Endometrium

Thickness: 10 mm.  No focal abnormality visualized.

Right ovary

Measurements: 2.7 x 3.2 x 2.2 cm. Normal appearance/no adnexal mass.

Left ovary

Measurements: 2.3 x 1.6 x 1.9 cm. Normal appearance/no adnexal mass.

Other findings

No free fluid.
IMPRESSION: No fibroids identified. Endometrial thickness measures 10 mm. If
bleeding remains unresponsive to hormonal or medical therapy,
sonohysterogram should be considered for focal lesion work-up. (Ref:
Radiological Reasoning: Algorithmic Workup of Abnormal Vaginal
Bleeding with Endovaginal Sonography and Sonohysterography. AJR
0773; 191:S68-73) the

Normal appearance of both ovaries. No adnexal mass or other
significant abnormality identified.

## 2015-08-01 ENCOUNTER — Other Ambulatory Visit (HOSPITAL_COMMUNITY)
Admission: RE | Admit: 2015-08-01 | Discharge: 2015-08-01 | Disposition: A | Discharge status: Home / Self Care | Payer: Medicaid Other | Source: Ambulatory Visit | Attending: Family Medicine | Admitting: Family Medicine

## 2015-08-01 ENCOUNTER — Emergency Department (HOSPITAL_COMMUNITY)
Admission: EM | Admit: 2015-08-01 | Discharge: 2015-08-01 | Disposition: A | Discharge status: Home / Self Care | Payer: Medicaid Other | Source: Home / Self Care | Attending: Family Medicine | Admitting: Family Medicine

## 2015-08-01 ENCOUNTER — Encounter (HOSPITAL_COMMUNITY): Payer: Self-pay | Admitting: *Deleted

## 2015-08-01 DIAGNOSIS — N390 Urinary tract infection, site not specified: Secondary | ICD-10-CM

## 2015-08-01 LAB — POCT URINALYSIS DIP (DEVICE)
BILIRUBIN URINE: NEGATIVE
Glucose, UA: NEGATIVE mg/dL
HGB URINE DIPSTICK: NEGATIVE
Ketones, ur: NEGATIVE mg/dL
LEUKOCYTES UA: NEGATIVE
NITRITE: NEGATIVE
Protein, ur: NEGATIVE mg/dL
SPECIFIC GRAVITY, URINE: 1.025 (ref 1.005–1.030)
Urobilinogen, UA: 0.2 mg/dL (ref 0.0–1.0)
pH: 6.5 (ref 5.0–8.0)

## 2015-08-01 MED ORDER — CEPHALEXIN 500 MG PO CAPS: 500.0000 mg | ORAL_CAPSULE | Freq: Four times a day (QID) | ORAL | Status: DC

## 2015-08-01 NOTE — Discharge Instructions (Signed)
Take all of medicine as directed, drink lots of fluids, see your doctor if further problems. °

## 2015-08-01 NOTE — ED Provider Notes (Addendum)
CSN: [id number]     Arrival date & time 08/01/15  1437 History   None    Chief Complaint  Patient presents with  . Urinary Tract Infection   (Consider location/radiation/quality/duration/timing/severity/associated sxs/prior Treatment) Patient is a 50 y.o. female presenting with urinary tract infection. The history is provided by the patient.  Urinary Tract Infection Pain quality:  Burning Pain severity:  Mild Onset quality:  Gradual Duration:  2 weeks Progression:  Unchanged Chronicity:  New Recent urinary tract infections: no (similar problem 3/16, resolved with keflex.)   Relieved by:  None tried Worsened by:  Nothing tried Ineffective treatments:  None tried Urinary symptoms: frequent urination   Associated symptoms: no abdominal pain, no fever, no flank pain, no nausea, no vaginal discharge and no vomiting   Risk factors: hx of pyelonephritis     History reviewed. No pertinent past medical history. Past Surgical History  Procedure Laterality Date  . Cesarian    . Cesarean section    . Novasure ablation N/A 06/01/2013    Procedure: NOVASURE ABLATION;  Surgeon: Allie Bossier, MD;  Location: [facility];  Service: Gynecology;  Laterality: N/A;   History reviewed. No pertinent family history. Social History  Substance Use Topics  . Smoking status: Current Every Day Smoker -- 1.00 packs/day    Types: Cigarettes  . Smokeless tobacco: None  . Alcohol Use: Yes     Comment: occasional   OB History    Gravida Para Term Preterm AB TAB SAB Ectopic Multiple Living   Review of Systems  Constitutional: Negative.  Negative for fever.  Gastrointestinal: Negative.  Negative for nausea, vomiting and abdominal pain.  Genitourinary: Positive for dysuria, urgency and frequency. Negative for hematuria, flank pain and vaginal discharge.  All other systems reviewed and are negative.   Allergies  Review of patient's allergies indicates no known allergies.  Home  Medications   Prior to Admission medications   Medication Sig Start Date End Date Taking? Authorizing Provider  cephALEXin (KEFLEX) 500 MG capsule Take 1 capsule (500 mg total) by mouth 4 (four) times daily. Take all of medicine and drink lots of fluids 08/01/15   Linna Hoff, MD  HYDROcodone-acetaminophen (NORCO/VICODIN) 5-325 MG per tablet Take 1 tablet by mouth every 6 (six) hours as needed. 02/15/14   Derwood Kaplan, MD  ibuprofen (ADVIL,MOTRIN) 600 MG tablet Take 1 tablet (600 mg total) by mouth every 6 (six) hours as needed. 02/15/14   Derwood Kaplan, MD  neomycin-bacitracin-polymyxin (NEOSPORIN) ointment Apply 1 application topically every 12 (twelve) hours. apply to eye 02/15/14   Derwood Kaplan, MD  ondansetron (ZOFRAN ODT) 8 MG disintegrating tablet Take 1 tablet (8 mg total) by mouth every 8 (eight) hours as needed for nausea. 02/15/14   Derwood Kaplan, MD   Meds Ordered and Administered this Visit  Medications - No data to display  BP 120/76 mmHg  Pulse 78  Temp(Src) 98.6 F (37 C) (Oral)  Resp 18  SpO2 99% No data found.   Physical Exam  Constitutional: She is oriented to person, place, and time. She appears well-developed and well-nourished.  Abdominal: Soft. Bowel sounds are normal. She exhibits no mass. There is no tenderness. There is no rebound and no guarding.  Neurological: She is alert and oriented to person, place, and time.  Skin: Skin is warm and dry.  Nursing note and vitals reviewed.   ED Course  Procedures (including  critical care time)  Labs Review Labs Reviewed  URINE CULTURE  POCT URINALYSIS DIP (DEVICE)  u/a wnl.  Imaging Review No results found.   Visual Acuity Review  Right Eye Distance:   Left Eye Distance:   Bilateral Distance:    Right Eye Near:   Left Eye Near:    Bilateral Near:         MDM   1. UTI (lower urinary tract infection)    Meds ordered this encounter  Medications  . cephALEXin (KEFLEX) 500 MG capsule    Sig:  Take 1 capsule (500 mg total) by mouth 4 (four) times daily. Take all of medicine and drink lots of fluids    Dispense:  20 capsule    Refill:  0       Linna Hoff, MD 08/01/15 1646  Linna Hoff, MD 08/02/15 2044

## 2015-08-02 LAB — URINE CULTURE: SPECIAL REQUESTS: NORMAL

## 2018-02-03 ENCOUNTER — Encounter (HOSPITAL_COMMUNITY): Payer: Self-pay | Admitting: Emergency Medicine

## 2018-02-03 ENCOUNTER — Ambulatory Visit (HOSPITAL_COMMUNITY)
Admission: EM | Admit: 2018-02-03 | Discharge: 2018-02-03 | Disposition: A | Discharge status: Home / Self Care | Payer: No Typology Code available for payment source | Attending: Family Medicine | Admitting: Family Medicine

## 2018-02-03 DIAGNOSIS — N3000 Acute cystitis without hematuria: Secondary | ICD-10-CM

## 2018-02-03 DIAGNOSIS — R300 Dysuria: Secondary | ICD-10-CM

## 2018-02-03 DIAGNOSIS — F17210 Nicotine dependence, cigarettes, uncomplicated: Secondary | ICD-10-CM | POA: Insufficient documentation

## 2018-02-03 DIAGNOSIS — M545 Low back pain: Secondary | ICD-10-CM

## 2018-02-03 LAB — POCT URINALYSIS DIP (DEVICE)
BILIRUBIN URINE: NEGATIVE
Glucose, UA: NEGATIVE mg/dL
KETONES UR: NEGATIVE mg/dL
Nitrite: POSITIVE — AB
PH: 5.5 (ref 5.0–8.0)
PROTEIN: NEGATIVE mg/dL
Urobilinogen, UA: 0.2 mg/dL (ref 0.0–1.0)

## 2018-02-03 MED ORDER — CEPHALEXIN 500 MG PO CAPS: 500.0000 mg | ORAL_CAPSULE | Freq: Four times a day (QID) | ORAL | 0 refills | Status: AC

## 2018-02-03 NOTE — ED Triage Notes (Signed)
Pt states she thinks she has a UTI, states it doesn't "feel right" when she pees, also c/o bilateral flank pain.

## 2018-02-03 NOTE — Discharge Instructions (Signed)
Drink plenty of water to empty bladder regularly. Avoid alcohol and caffeine as these may irritate the bladder.  Complete course of antibiotics.  If symptoms worsen or do not improve in the next week to return to be seen or to follow up with your PCP.   

## 2018-02-03 NOTE — ED Provider Notes (Signed)
[facility]    CSN: [id number] Arrival date & time: 02/03/18  1608     History   Chief Complaint No chief complaint on file.   HPI [name] is a 52 y.o. female.   [name] presents with complaints of "funny feeling" with urination. Also with left low back pain. Urinary frequency. No blood in urine. Has had similar in the past which felt the same and were UTI's. No vaginal symptoms. Felt feverish today, took aleve which helped. Mild abdominal discomfort and nausea, no vomiting. Hx of tachycardia, acute pyelonephritis, arf, uti, fevers. States has been hospitalized due to UTI in the past.    ROS per HPI.      History reviewed. No pertinent past medical history.  Patient Active Problem List   Diagnosis Date Noted  . Tachycardia 12/21/2012  . Acute pyelonephritis 12/17/2012  . ARF (acute renal failure) (HCC) 12/15/2012  . UTI (lower urinary tract infection) 12/15/2012  . Fever 12/15/2012    Past Surgical History:  Procedure Laterality Date  . CESAREAN SECTION    . cesarian    . NOVASURE ABLATION N/A 06/01/2013   Procedure: NOVASURE ABLATION;  Surgeon: Allie Bossier, MD;  Location: [facility];  Service: Gynecology;  Laterality: N/A;    OB History    Gravida  2   Para  1   Term  1   Preterm      AB  1   Living  1     SAB  1   TAB      Ectopic      Multiple      Live Births               Home Medications    Prior to Admission medications   Medication Sig Start Date End Date Taking? Authorizing Provider  cephALEXin (KEFLEX) 500 MG capsule Take 1 capsule (500 mg total) by mouth 4 (four) times daily for 5 days. 02/03/18 02/08/18  Georgetta Haber, NP  neomycin-bacitracin-polymyxin (NEOSPORIN) ointment Apply 1 application topically every 12 (twelve) hours. apply to eye Patient not taking: Reported on 02/03/2018 02/15/14   Derwood Kaplan, MD    Family History No family history on file.  Social History Social History   Tobacco Use    . Smoking status: Current Every Day Smoker    Packs/day: 1.00    Types: Cigarettes  Substance Use Topics  . Alcohol use: Yes    Comment: occasional  . Drug use: Yes    Types: Marijuana     Allergies   Patient has no known allergies.   Review of Systems Review of Systems   Physical Exam Triage Vital Signs ED Triage Vitals  Enc Vitals Group     BP 02/03/18 1624 (!) 144/88     Pulse Rate 02/03/18 1623 77     Resp 02/03/18 1623 18     Temp 02/03/18 1623 98.6 F (37 C)     Temp src --      SpO2 02/03/18 1623 98 %     Weight --      Height --      Head Circumference --      Peak Flow --      Pain Score 02/03/18 1623 4     Pain Loc --      Pain Edu? --      Excl. in GC? --    No data found.  Updated Vital Signs BP (!) 144/88  Pulse 77   Temp 98.6 F (37 C)   Resp 18   SpO2 98%    Physical Exam  Constitutional: She is oriented to person, place, and time. She appears well-developed and well-nourished. No distress.  Cardiovascular: Normal rate, regular rhythm and normal heart sounds.  Pulmonary/Chest: Effort normal and breath sounds normal.  Abdominal: Soft. There is no tenderness. There is no rigidity, no rebound, no guarding and no CVA tenderness.  No cva tenderness, indicates some low back pain, not reproducible with palpation   Neurological: She is alert and oriented to person, place, and time.  Skin: Skin is warm and dry.     UC Treatments / Results  Labs (all labs ordered are listed, but only abnormal results are displayed) Labs Reviewed  POCT URINALYSIS DIP (DEVICE) - Abnormal; Notable for the following components:      Result Value   Hgb urine dipstick TRACE (*)    Nitrite POSITIVE (*)    Leukocytes, UA MODERATE (*)    All other components within normal limits  URINE CULTURE    EKG None  Radiology No results found.  Procedures Procedures (including critical care time)  Medications Ordered in UC Medications - No data to  display  Initial Impression / Assessment and Plan / UC Course  I have reviewed the triage vital signs and the nursing notes.  Pertinent labs & imaging results that were available during my care of the patient were reviewed by me and considered in my medical decision making (see chart for details).     Leuks, nitrite to urine. Culture obtained. Keflex initiated. Push fluids. Return precautions provided. Patient verbalized understanding and agreeable to plan.   Final Clinical Impressions(s) / UC Diagnoses   Final diagnoses:  Acute cystitis without hematuria     Discharge Instructions     Drink plenty of water to empty bladder regularly. Avoid alcohol and caffeine as these may irritate the bladder.   Complete course of antibiotics.   If symptoms worsen or do not improve in the next week to return to be seen or to follow up with your PCP.     ED Prescriptions    Medication Sig Dispense Auth. Provider   cephALEXin (KEFLEX) 500 MG capsule Take 1 capsule (500 mg total) by mouth 4 (four) times daily for 5 days. 20 capsule Georgetta HaberBurky, Ell Tiso B, NP     Controlled Substance Prescriptions [place] Controlled Substance Registry consulted? Not Applicable   Georgetta HaberBurky, Kaori Jumper B, NP 02/03/18 1649

## 2018-02-05 ENCOUNTER — Telehealth (HOSPITAL_COMMUNITY): Payer: Self-pay

## 2018-02-05 LAB — URINE CULTURE: Culture: >=100000 — AB

## 2018-02-05 NOTE — Telephone Encounter (Signed)
Urine culture positive for E.coli.  This was treated with Keflex. Contacted patient to make aware. Pt reports still feeling bad and having back pain with fevers. Amy PA looked at the patients chart and stated that she should continue medication for the full 5 days and if she is still having symptoms to call and we will extend the antibiotic. Pt encouraged to be seen again however if she feels like she is getting worse not better with worsening back pain or fevers. Pt verbalized understanding.
